# Patient Record
Sex: Female | Born: 1965 | Hispanic: No | Marital: Single | Smoking: Never smoker
Health system: Southern US, Community
[De-identification: ages and names within clinical notes are randomized; demographics above are authoritative.]

## PROBLEM LIST (undated history)

## (undated) DIAGNOSIS — R112 Nausea with vomiting, unspecified: Secondary | ICD-10-CM

## (undated) DIAGNOSIS — D259 Leiomyoma of uterus, unspecified: Secondary | ICD-10-CM

## (undated) DIAGNOSIS — Z5189 Encounter for other specified aftercare: Secondary | ICD-10-CM

## (undated) DIAGNOSIS — J189 Pneumonia, unspecified organism: Secondary | ICD-10-CM

## (undated) DIAGNOSIS — T7840XA Allergy, unspecified, initial encounter: Secondary | ICD-10-CM

## (undated) DIAGNOSIS — D649 Anemia, unspecified: Secondary | ICD-10-CM

## (undated) DIAGNOSIS — Z9889 Other specified postprocedural states: Secondary | ICD-10-CM

## (undated) HISTORY — PX: OTHER SURGICAL HISTORY: SHX169

## (undated) HISTORY — DX: Pneumonia, unspecified organism: J18.9

## (undated) HISTORY — DX: Allergy, unspecified, initial encounter: T78.40XA

## (undated) HISTORY — DX: Encounter for other specified aftercare: Z51.89

## (undated) HISTORY — DX: Anemia, unspecified: D64.9

## (undated) HISTORY — PX: MYOMECTOMY: SHX85

---

## 2011-03-16 ENCOUNTER — Ambulatory Visit: Payer: Self-pay | Admitting: Internal Medicine

## 2011-03-16 DIAGNOSIS — Z0289 Encounter for other administrative examinations: Secondary | ICD-10-CM

## 2011-04-04 ENCOUNTER — Encounter (HOSPITAL_COMMUNITY): Payer: Self-pay | Admitting: *Deleted

## 2011-04-04 ENCOUNTER — Other Ambulatory Visit: Payer: Self-pay | Admitting: Obstetrics and Gynecology

## 2011-04-13 NOTE — H&P (Signed)
NAMEMarland Kitchen  Stephanie Hunter, Stephanie Hunter NO.:  [id number]  MEDICAL RECORD NO.:  [id number]  LOCATION:  [facility]                         FACILITY:  [facility]  PHYSICIAN:  Janine Limbo, M.D.DATE OF BIRTH:  Sep 26, 1965  DATE OF ADMISSION:  04/04/2011 DATE OF DISCHARGE:                             HISTORY & PHYSICAL   Date of surgery is April 19, 2011.  HISTORY OF PRESENT ILLNESS:  Stephanie Hunter is a 46 year old female, para 0- 0-1-0, who presents for hysteroscopy with resection of endometrial polyps.  She will then have a dilatation and curettage.  The patient has been followed at the [facility].  The patient complains menorrhagia, fibroids, and anemia.  Her evaluation included an ultrasound which showed 11.5 x 9.52 cm uterus.  Multiple fibroids were noted with the largest fibroid measuring 3.81 cm.  The ovaries appeared normal.  A hydrosonogram showed a 1.69 cm endometrial polyp.  The patient had an endometrial biopsy that was benign.  Her most recent Pap smear was within normal limits.  Gonorrhea and Chlamydia cultures were negative. The patient is currently taking oral contraceptives.  DRUG ALLERGIES:  The patient has no drug allergies.  She is allergic to latex.  PAST MEDICAL HISTORY:  The patient has a history of anemia.  She had a Bartholin cyst removed in 2001 and again in 2011.  She had an abdominal myomectomy in 2008.  OBSTETRICAL HISTORY:  The patient has had one elective pregnancy termination.  SOCIAL HISTORY:  The patient denies cigarette use, alcohol use, and recreational drug use.  REVIEW OF SYSTEMS:  Please see history of present illness.  FAMILY HISTORY:  Noncontributory.  PHYSICAL EXAMINATION:  VITAL SIGNS:  Height is 5 feet 4 inches, weight is 217 pounds. HEENT:  Within normal limits.  CHEST:  Clear. HEART:  Regular rate and rhythm. BREASTS:  Without masses.  ABDOMEN:   Nontender. EXTREMITIES:  Grossly normal. NEUROLOGIC:  Grossly normal. PELVIC:  External genitalia is within normal limits.  Vagina is normal. Cervix is normal.  Uterus is upper limits of normal size as best I can tell.  Adnexa no masses. RECTOVAGINAL:  Confirms.  ASSESSMENT: 1. Menorrhagia. 2. Anemia. 3. Fibroid uterus. 4. Endometrial polyps. 5. Obesity.  PLAN:  The patient will undergo hysteroscopy with resection of endometrial polyps.  She will also have a dilatation and curettage.  She understands the indications for her surgical procedure, and she accepts the risks of, but not limited to, anesthetic complications, bleeding, infections, and possible damage to the surrounding organs.     Janine Limbo, M.D.     AVS/MEDQ  D:  04/12/2011  T:  04/13/2011  Job:  [id number]

## 2011-04-15 ENCOUNTER — Encounter (HOSPITAL_COMMUNITY): Payer: Self-pay | Admitting: Pharmacist

## 2011-04-19 ENCOUNTER — Encounter (HOSPITAL_COMMUNITY)
Admission: RE | Disposition: A | Discharge status: Home / Self Care | Payer: Self-pay | Source: Ambulatory Visit | Attending: Obstetrics and Gynecology

## 2011-04-19 ENCOUNTER — Encounter (HOSPITAL_COMMUNITY): Payer: Self-pay | Admitting: *Deleted

## 2011-04-19 ENCOUNTER — Other Ambulatory Visit: Payer: Self-pay | Admitting: Obstetrics and Gynecology

## 2011-04-19 ENCOUNTER — Encounter (HOSPITAL_COMMUNITY): Payer: Self-pay | Admitting: Anesthesiology

## 2011-04-19 ENCOUNTER — Ambulatory Visit (HOSPITAL_COMMUNITY)
Admission: RE | Admit: 2011-04-19 | Discharge: 2011-04-19 | Disposition: A | Discharge status: Home / Self Care | Payer: 59 | Source: Ambulatory Visit | Attending: Obstetrics and Gynecology | Admitting: Obstetrics and Gynecology

## 2011-04-19 ENCOUNTER — Ambulatory Visit (HOSPITAL_COMMUNITY): Payer: 59 | Admitting: Anesthesiology

## 2011-04-19 DIAGNOSIS — N840 Polyp of corpus uteri: Secondary | ICD-10-CM | POA: Insufficient documentation

## 2011-04-19 DIAGNOSIS — D649 Anemia, unspecified: Secondary | ICD-10-CM | POA: Insufficient documentation

## 2011-04-19 DIAGNOSIS — N920 Excessive and frequent menstruation with regular cycle: Secondary | ICD-10-CM | POA: Insufficient documentation

## 2011-04-19 DIAGNOSIS — D259 Leiomyoma of uterus, unspecified: Secondary | ICD-10-CM | POA: Insufficient documentation

## 2011-04-19 HISTORY — DX: Nausea with vomiting, unspecified: R11.2

## 2011-04-19 HISTORY — DX: Other specified postprocedural states: Z98.890

## 2011-04-19 HISTORY — DX: Leiomyoma of uterus, unspecified: D25.9

## 2011-04-19 LAB — CBC
MCV: 82.7 fL (ref 78.0–100.0)
Platelets: 487 10*3/uL — ABNORMAL HIGH (ref 150–400)
RBC: 4.05 MIL/uL (ref 3.87–5.11)
WBC: 9.5 10*3/uL (ref 4.0–10.5)

## 2011-04-19 SURGERY — DILATATION & CURETTAGE/HYSTEROSCOPY WITH RESECTOCOPE
Anesthesia: General | Site: Uterus | Wound class: Clean Contaminated

## 2011-04-19 MED ORDER — KETOROLAC TROMETHAMINE 30 MG/ML IJ SOLN
INTRAMUSCULAR | Status: DC | PRN
  Administered 2011-04-19: 30 mg via INTRAVENOUS

## 2011-04-19 MED ORDER — GLYCOPYRROLATE 0.2 MG/ML IJ SOLN
INTRAMUSCULAR | Status: DC | PRN
  Administered 2011-04-19: 0.3 mg via INTRAVENOUS

## 2011-04-19 MED ORDER — BUPIVACAINE-EPINEPHRINE (PF) 0.5% -1:200000 IJ SOLN
INTRAMUSCULAR | Status: AC
  Filled 2011-04-19: qty 10

## 2011-04-19 MED ORDER — BUPIVACAINE-EPINEPHRINE 0.5% -1:200000 IJ SOLN
INTRAMUSCULAR | Status: DC | PRN
  Administered 2011-04-19: 10 mL

## 2011-04-19 MED ORDER — PROPOFOL 10 MG/ML IV EMUL
INTRAVENOUS | Status: DC | PRN
  Administered 2011-04-19: 50 mg via INTRAVENOUS

## 2011-04-19 MED ORDER — PROPOFOL 10 MG/ML IV EMUL
INTRAVENOUS | Status: AC
  Filled 2011-04-19: qty 20

## 2011-04-19 MED ORDER — PROMETHAZINE HCL 12.5 MG PO TABS: 12.5000 mg | ORAL_TABLET | Freq: Four times a day (QID) | ORAL | Status: AC | PRN

## 2011-04-19 MED ORDER — ONDANSETRON HCL 4 MG/2ML IJ SOLN
INTRAMUSCULAR | Status: DC | PRN
  Administered 2011-04-19: 4 mg via INTRAVENOUS

## 2011-04-19 MED ORDER — ONDANSETRON HCL 4 MG/2ML IJ SOLN
INTRAMUSCULAR | Status: AC
  Filled 2011-04-19: qty 2

## 2011-04-19 MED ORDER — KETOROLAC TROMETHAMINE 60 MG/2ML IM SOLN
INTRAMUSCULAR | Status: DC | PRN
  Administered 2011-04-19: 30 mg via INTRAMUSCULAR

## 2011-04-19 MED ORDER — DEXAMETHASONE SODIUM PHOSPHATE 4 MG/ML IJ SOLN
INTRAMUSCULAR | Status: DC | PRN
  Administered 2011-04-19: 10 mg via INTRAVENOUS

## 2011-04-19 MED ORDER — SCOPOLAMINE 1 MG/3DAYS TD PT72
MEDICATED_PATCH | TRANSDERMAL | Status: AC
  Administered 2011-04-19: 1.5 mg via TRANSDERMAL
  Filled 2011-04-19: qty 1

## 2011-04-19 MED ORDER — FENTANYL CITRATE 0.05 MG/ML IJ SOLN
INTRAMUSCULAR | Status: DC | PRN
  Administered 2011-04-19: 50 ug via INTRAVENOUS
  Administered 2011-04-19: 100 ug via INTRAVENOUS

## 2011-04-19 MED ORDER — MORPHINE SULFATE 10 MG/ML IJ SOLN
INTRAMUSCULAR | Status: AC
  Filled 2011-04-19: qty 1

## 2011-04-19 MED ORDER — IBUPROFEN 200 MG PO TABS: 800.0000 mg | ORAL_TABLET | Freq: Three times a day (TID) | ORAL | Status: AC | PRN

## 2011-04-19 MED ORDER — MIDAZOLAM HCL 2 MG/2ML IJ SOLN
INTRAMUSCULAR | Status: AC
  Filled 2011-04-19: qty 2

## 2011-04-19 MED ORDER — GLYCINE 1.5 % IR SOLN
Status: DC | PRN
  Administered 2011-04-19: 3000 mL

## 2011-04-19 MED ORDER — DEXAMETHASONE SODIUM PHOSPHATE 10 MG/ML IJ SOLN
INTRAMUSCULAR | Status: AC
  Filled 2011-04-19: qty 1

## 2011-04-19 MED ORDER — SCOPOLAMINE 1 MG/3DAYS TD PT72
1.0000 | MEDICATED_PATCH | Freq: Once | TRANSDERMAL | Status: DC
  Administered 2011-04-19: 1.5 mg via TRANSDERMAL

## 2011-04-19 MED ORDER — FENTANYL CITRATE 0.05 MG/ML IJ SOLN
INTRAMUSCULAR | Status: AC
  Filled 2011-04-19: qty 5

## 2011-04-19 MED ORDER — KETOROLAC TROMETHAMINE 60 MG/2ML IM SOLN
INTRAMUSCULAR | Status: AC
  Filled 2011-04-19: qty 2

## 2011-04-19 MED ORDER — LIDOCAINE HCL (CARDIAC) 20 MG/ML IV SOLN
INTRAVENOUS | Status: AC
  Filled 2011-04-19: qty 5

## 2011-04-19 MED ORDER — LIDOCAINE HCL (CARDIAC) 20 MG/ML IV SOLN
INTRAVENOUS | Status: DC | PRN
  Administered 2011-04-19: 60 mg via INTRAVENOUS

## 2011-04-19 MED ORDER — LACTATED RINGERS IV SOLN
INTRAVENOUS | Status: DC
  Administered 2011-04-19 (×2): via INTRAVENOUS

## 2011-04-19 MED ORDER — MIDAZOLAM HCL 5 MG/5ML IJ SOLN
INTRAMUSCULAR | Status: DC | PRN
  Administered 2011-04-19: 2 mg via INTRAVENOUS

## 2011-04-19 MED ORDER — OXYCODONE-ACETAMINOPHEN 5-325 MG PO TABS: 1.0000 | ORAL_TABLET | ORAL | Status: AC | PRN

## 2011-04-19 SURGICAL SUPPLY — 16 items
CANISTER SUCTION 2500CC (MISCELLANEOUS) ×2 IMPLANT
CATH ROBINSON RED A/P 16FR (CATHETERS) ×2 IMPLANT
CLOTH BEACON ORANGE TIMEOUT ST (SAFETY) ×2 IMPLANT
CONTAINER PREFILL 10% NBF 60ML (FORM) ×6 IMPLANT
DRAPE PROXIMA HALF (DRAPES) ×2 IMPLANT
ELECT REM PT RETURN 9FT ADLT (ELECTROSURGICAL) ×2
ELECTRODE REM PT RTRN 9FT ADLT (ELECTROSURGICAL) ×1 IMPLANT
GLOVE BIOGEL PI IND STRL 8.5 (GLOVE) ×1 IMPLANT
GLOVE BIOGEL PI INDICATOR 8.5 (GLOVE) ×1
GLOVE ECLIPSE 8.0 STRL XLNG CF (GLOVE) ×4 IMPLANT
GOWN PREVENTION PLUS LG XLONG (DISPOSABLE) ×4 IMPLANT
GOWN STRL REIN XL XLG (GOWN DISPOSABLE) ×2 IMPLANT
LOOP ANGLED CUTTING 22FR (CUTTING LOOP) ×2 IMPLANT
PACK HYSTEROSCOPY LF (CUSTOM PROCEDURE TRAY) ×2 IMPLANT
TOWEL OR 17X24 6PK STRL BLUE (TOWEL DISPOSABLE) ×4 IMPLANT
WATER STERILE IRR 1000ML POUR (IV SOLUTION) ×2 IMPLANT

## 2011-04-19 NOTE — Anesthesia Preprocedure Evaluation (Signed)
Anesthesia Evaluation  Patient identified by MRN, date of birth, ID band Patient awake    Reviewed: Allergy & Precautions, H&P , Patient's Chart, lab work & pertinent test results, reviewed documented beta blocker date and time   History of Anesthesia Complications (+) PONV  Airway Mallampati: II TM Distance: >3 FB Neck ROM: full    Dental No notable dental hx.    Pulmonary neg pulmonary ROS,  clear to auscultation  Pulmonary exam normal       Cardiovascular Exercise Tolerance: Good neg cardio ROS regular Normal    Neuro/Psych Negative Neurological ROS  Negative Psych ROS   GI/Hepatic negative GI ROS, Neg liver ROS,   Endo/Other  Negative Endocrine ROS  Renal/GU negative Renal ROS     Musculoskeletal   Abdominal   Peds  Hematology negative hematology ROS (+)   Anesthesia Other Findings   Reproductive/Obstetrics negative OB ROS                           Anesthesia Physical Anesthesia Plan  ASA: II  Anesthesia Plan: General LMA   Post-op Pain Management:    Induction:   Airway Management Planned:   Additional Equipment:   Intra-op Plan:   Post-operative Plan:   Informed Consent: I have reviewed the patients History and Physical, chart, labs and discussed the procedure including the risks, benefits and alternatives for the proposed anesthesia with the patient or authorized representative who has indicated his/her understanding and acceptance.   Dental Advisory Given  Plan Discussed with: CRNA, Surgeon and Anesthesiologist  Anesthesia Plan Comments:         Anesthesia Quick Evaluation  

## 2011-04-19 NOTE — Discharge Instructions (Signed)
Hysteroscopy Hysteroscopy is a procedure used for looking inside the womb (uterus). It may be done for many different reasons, including:  To evaluate abnormal bleeding, fibroid (benign, noncancerous) tumors, polyps, scar tissue (adhesions), and possibly cancer of the uterus.   To look for lumps (tumors) and other uterine growths.   To look for causes of why a woman cannot get pregnant (infertility), causes of recurrent loss of pregnancy (miscarriages), or a lost intrauterine device (IUD).   To perform a sterilization by blocking the fallopian tubes from inside the uterus.  A hysteroscopy should be done right after a menstrual period to be sure you are not pregnant. LET YOUR CAREGIVER KNOW ABOUT:   Allergies.   Medicines taken, including herbs, eyedrops, over-the-counter medicines, and creams.   Use of steroids (by mouth or creams).   Previous problems with anesthetics or numbing medicines.   History of bleeding or blood problems.   History of blood clots.   Possibility of pregnancy, if this applies.   Previous surgery.   Other health problems.  RISKS AND COMPLICATIONS   Putting a hole in the uterus.   Excessive bleeding.   Infection.   Damage to the cervix.   Injury to other organs.   Allergic reaction to medicines.   Too much fluid used in the uterus for the procedure.  BEFORE THE PROCEDURE   Do not take aspirin or blood thinners for a week before the procedure, or as directed. It can cause bleeding.   Arrive at least 60 minutes before the procedure or as directed to read and sign the necessary forms.   Arrange for someone to take you home after the procedure.   If you smoke, do not smoke for 2 weeks before the procedure.  PROCEDURE   Your caregiver may give you medicine to relax you. He or she may also give you a medicine that numbs the area around the cervix (local anesthetic) or a medicine that makes you sleep (general anesthesia).   Sometimes, a  medicine is placed in the cervix the day before the procedure. This medicine makes the cervix have a larger opening (dilate). This makes it easier for the instrument to be inserted into the uterus.   A small instrument (hysteroscope) is inserted through the vagina into the uterus. This instrument is similar to a pencil-sized telescope with a light.   During the procedure, air or a liquid is put into the uterus, which allows the surgeon to see better.   Sometimes, tissue is gently scraped from inside the uterus. These tissue samples are sent to a specialist who looks at tissue samples (pathologist). The pathologist will give a report to your caregiver. This will help your caregiver decide if further treatment is necessary. The report will also help your caregiver decide on the best treatment if the test comes back abnormal.  AFTER THE PROCEDURE   If you had a general anesthetic, you may be groggy for a couple hours after the procedure.   If you had a local anesthetic, you will be advised to rest at the surgical center or caregiver's office until you are stable and feel ready to go home.   You may have some cramping for a couple days.   You may have bleeding, which varies from light spotting for a few days to menstrual-like bleeding for up to 3 to 7 days. This is normal.   Have someone take you home.  FINDING OUT THE RESULTS OF YOUR TEST Not all test results  are available during your visit. If your test results are not back during the visit, make an appointment with your caregiver to find out the results. Do not assume everything is normal if you have not heard from your caregiver or the medical facility. It is important for you to follow up on all of your test results. HOME CARE INSTRUCTIONS   Do not drive for 24 hours or as instructed.   Only take over-the-counter or prescription medicines for pain, discomfort, or fever as directed by your caregiver.   Do not take aspirin. It can cause or  aggravate bleeding.   Do not drive or drink alcohol while taking pain medicine.   You may resume your usual diet.   Do not use tampons, douche, or have sexual intercourse for 2 weeks, or as advised by your caregiver.   Rest and sleep for the first 24 to 48 hours.   Take your temperature twice a day for 4 to 5 days. Write it down. Give these temperatures to your caregiver if they are abnormal (above 98.6 F or 37.0 C).   Take medicines your caregiver has ordered as directed.   Follow your caregiver's advice regarding diet, exercise, lifting, driving, and general activities.   Take showers instead of baths for 2 weeks, or as recommended by your caregiver.   If you develop constipation:   Take a mild laxative with the advice of your caregiver.   Eat bran foods.   Drink enough water and fluids to keep your urine clear or pale yellow.   Try to have someone with you or available to you for the first 24 to 48 hours, especially if you had a general anesthetic.   Make sure you and your family understand everything about your operation and recovery.   Follow your caregiver's advice regarding follow-up appointments and Pap smears.  SEEK MEDICAL CARE IF:   You feel dizzy or lightheaded.   You feel sick to your stomach (nauseous).   You develop abnormal vaginal discharge.   You develop a rash.   You have an abnormal reaction or allergy to your medicine.   You need stronger pain medicine.  SEEK IMMEDIATE MEDICAL CARE IF:   Bleeding is heavier than a normal menstrual period or you have blood clots.   You have an oral temperature above 102 F (38.9 C), not controlled by medicine.   You have increasing cramps or pains not relieved with medicine.   You develop belly (abdominal) pain that does not seem to be related to the same area of earlier cramping and pain.   You pass out.   You develop pain in the tops of your shoulders (shoulder strap areas).   You develop shortness of  breath.  MAKE SURE YOU:   Understand these instructions.   Will watch your condition.   Will get help right away if you are not doing well or get worse.  Document Released: 06/04/2000 Document Revised: 11/08/2010 Document Reviewed: 09/27/2008 [facility] Patient Information 2012 [place], [place].DISCHARGE INSTRUCTIONS: D&C / D&E The following instructions have been prepared to help you care for yourself upon your return home.   Personal hygiene:  Use sanitary pads for vaginal drainage, not tampons.  Shower the day after your procedure.  NO tub baths, pools or Jacuzzis for 2-3 weeks.  Wipe front to back after using the bathroom.  Activity and limitations:  Do NOT drive or operate any equipment for 24 hours. The effects of anesthesia are still present and drowsiness  may result.  Do NOT rest in bed all day.  Walking is encouraged.  Walk up and down stairs slowly.  You may resume your normal activity in one to two days or as indicated by your physician.  Sexual activity: NO intercourse for at least 2 weeks after the procedure, or as indicated by your physician.  Diet: Eat a light meal as desired this evening. You may resume your usual diet tomorrow.  Return to work: You may resume your work activities in one to two days or as indicated by your doctor.  What to expect after your surgery: Expect to have vaginal bleeding/discharge for 2-3 days and spotting for up to 10 days. It is not unusual to have soreness for up to 1-2 weeks. You may have a slight burning sensation when you urinate for the first day. Mild cramps may continue for a couple of days. You may have a regular period in 2-6 weeks.  Call your doctor for any of the following:  Excessive vaginal bleeding, saturating and changing one pad every hour.  Inability to urinate 6 hours after discharge from hospital.  Pain not relieved by pain medication.  Fever of 100.4 F or greater.  Unusual vaginal discharge or  odor.  Return to office ________________ Call for an appointment ___________________  Patients signature: ______________________  Nurses signature ________________________  Post Anesthesia Care Unit [phone]

## 2011-04-19 NOTE — Transfer of Care (Signed)
Immediate Anesthesia Transfer of Care Note  Patient: Stephanie Hunter  Procedure(s) Performed:  DILATATION & CURETTAGE/HYSTEROSCOPY WITH RESECTOCOPE  Patient Location: PACU  Anesthesia Type: General  Level of Consciousness: awake, alert  and oriented  Airway & Oxygen Therapy: Patient Spontanous Breathing and Patient connected to nasal cannula oxygen  Post-op Assessment: Report given to PACU RN and Post -op Vital signs reviewed and stable  Post vital signs: Reviewed and stable  Complications: No apparent anesthesia complications

## 2011-04-19 NOTE — H&P (Signed)
The patient was interviewed and examined today.  The previously documented history and physical examination was reviewed. There are no changes. The operative procedure was reviewed. The risks and benefits were outlined again. The specific risks include, but are not limited to, anesthetic complications, bleeding, infections, and possible damage to the surrounding organs. The patient's questions were answered.  We are ready to proceed as outlined. The likelihood of the patient achieving the goals of this procedure is very likely.   Stephanie Hunter, M.D.  

## 2011-04-19 NOTE — Op Note (Signed)
OPERATIVE NOTE  Stephanie Hunter  DOB:    04/27/1965  MRN:    [id number]  CSN:    [id number]  Date of Surgery:  04/19/2011  Preoperative Diagnosis:  Menorrhagia  Anemia (hemoglobin 10.7)  Fibroids  Endometrial polyps  Obesity  Postoperative Diagnosis:  Same  Procedure:  Hysteroscopy with resection of endometrial polyps  Dilatation and curettage  Surgeon:  Leonard Schwartz, M.D.  Assistant:  None  Anesthetic:  Gen.  Disposition:  The patient is a 46 year old female, para 0-0-1-0, who presents with the above-mentioned diagnosis. She understands the indications for surgical procedure and she accepts the risk of, but not limited to, anesthetic complications, bleeding, infections, and possible damage to the surrounding organs.  Findings:  The uterus is 12-14 week size. Multiple fibroids are present. No adnexal masses were appreciated. On hysteroscopy the patient was noted to have several endometrial polyps with the largest polyp measuring approximately 1-1/2 cm in size. No other pathology was appreciated.  Procedure:  The patient was taken to the operating room where a general anesthetic was given. The patient's abdomen, perineum, and vagina were prepped with multiple layers of Betadine. The bladder was drained of urine. Examination under anesthesia was performed. A paracervical block was placed using 10 cc of half percent Marcaine with epinephrine. An endocervical curettage was performed. The uterus sounded to 10 cm. The cervix was gently dilated. The diagnostic hysteroscope was inserted and the cavity was carefully inspected. Pictures were taken. Polyps were noted. The diagnostic hysteroscope was removed and the cervix was dilated further. The operative hysteroscope was inserted. A single loop was used to resect the endometrial polyps. The cavity was then curetted using a medium sharp curet. The cavity was to be clean at the end of our procedure. The  hysteroscope was reinserted and the cavity was inspected. No polyps were noted. On sutures were removed. The uterus was reexamined and was noted to be firm. Hemostasis was adequate. Sponge and needle counts were correct. The estimated blood loss was less than team 10 cc. The estimated fluid deficit was 155 cc. The patient was awakened from her anesthetic without difficulty and she was then transported to the recovery room in stable condition. The endocervical curettings, endometrial resections, and endometrial curettings were sent to pathology.  Followup instructions:  The patient will return to see Dr. Stefano Gaul in 2 weeks for followup examination. She was given a prescription for Motrin, Percocet, and Phenergan. She will call for questions or concerns.  Leonard Schwartz, M.D.

## 2011-04-19 NOTE — Anesthesia Procedure Notes (Signed)
Procedure Name: LMA Insertion Date/Time: 04/19/2011 9:35 AM Performed by: Isabella Bowens Pre-anesthesia Checklist: Patient identified, Emergency Drugs available, Suction available, Patient being monitored and Timeout performed Patient Re-evaluated:Patient Re-evaluated prior to inductionOxygen Delivery Method: Circle System Utilized Preoxygenation: Pre-oxygenation with 100% oxygen Intubation Type: IV induction Ventilation: Mask ventilation without difficulty LMA: LMA inserted LMA Size: 4.0 Grade View: Grade II Number of attempts: 1 Placement Confirmation: positive ETCO2 and breath sounds checked- equal and bilateral Dental Injury: Teeth and Oropharynx as per pre-operative assessment  Difficulty Due To: Difficulty was unanticipated

## 2011-04-20 NOTE — Anesthesia Postprocedure Evaluation (Signed)
Anesthesia Post Note  Patient: Stephanie Hunter  Procedure(s) Performed:  DILATATION & CURETTAGE/HYSTEROSCOPY WITH RESECTOCOPE  Anesthesia type: GA  Patient location: PACU  Post pain: Pain level controlled  Post assessment: Post-op Vital signs reviewed  Last Vitals:  Filed Vitals:   04/19/11 1115  BP: 107/53  Pulse: 91  Temp: 36.9 C  Resp: 15    Post vital signs: Reviewed  Level of consciousness: sedated  Complications: No apparent anesthesia complications

## 2012-02-26 ENCOUNTER — Encounter: Payer: Self-pay | Admitting: Obstetrics and Gynecology

## 2012-02-26 ENCOUNTER — Ambulatory Visit (INDEPENDENT_AMBULATORY_CARE_PROVIDER_SITE_OTHER): Payer: 59 | Admitting: Obstetrics and Gynecology

## 2012-02-26 ENCOUNTER — Other Ambulatory Visit: Payer: Self-pay | Admitting: Obstetrics and Gynecology

## 2012-02-26 VITALS — BP 110/70 | Resp 16 | Wt 219.0 lb

## 2012-02-26 DIAGNOSIS — Z01419 Encounter for gynecological examination (general) (routine) without abnormal findings: Secondary | ICD-10-CM

## 2012-02-26 DIAGNOSIS — Z124 Encounter for screening for malignant neoplasm of cervix: Secondary | ICD-10-CM

## 2012-02-26 DIAGNOSIS — Z1231 Encounter for screening mammogram for malignant neoplasm of breast: Secondary | ICD-10-CM

## 2012-02-26 DIAGNOSIS — N920 Excessive and frequent menstruation with regular cycle: Secondary | ICD-10-CM

## 2012-02-26 MED ORDER — NORETHINDRONE ACET-ETHINYL EST 1-20 MG-MCG PO TABS: 1.0000 | ORAL_TABLET | Freq: Every day | ORAL | Status: AC

## 2012-02-26 NOTE — Progress Notes (Signed)
ANNUAL GYNECOLOGIC EXAMINATION   Stephanie Hunter is a 46 y.o. female, G1P0010, who presents for an annual exam. The patient has a history of menorrhagia.  She takes oral contraceptives.  Her cycles are much better.  She had a D&C in 2013.  Pathology report was benign.   History   Social History  . Marital Status: Single    Spouse Name: N/A    Number of Children: N/A  . Years of Education: N/A   Social History Main Topics  . Smoking status: Never Smoker   . Smokeless tobacco: Never Used  . Alcohol Use: No  . Drug Use: No  . Sexually Active: Yes -- Female partner(s)    Birth Control/ Protection: Pill     Comment: loestrin 1/20   Other Topics Concern  . None   Social History Narrative  . None    Menstrual cycle:   LMP: Patient's last menstrual period was 02/05/2012.             The following portions of the patient's history were reviewed and updated as appropriate: allergies, current medications, past family history, past medical history, past social history, past surgical history and problem list.  Review of Systems Pertinent items are noted in HPI. Breast:Negative for breast lump,nipple discharge or nipple retraction Gastrointestinal: Negative for abdominal pain, change in bowel habits or rectal bleeding Urinary:negative   Objective:    BP 110/70  Resp 16  Wt 219 lb (99.338 kg)  LMP 02/05/2012    Weight:  Wt Readings from Last 1 Encounters:  02/26/12 219 lb (99.338 kg)          BMI: There is no height on file to calculate BMI.  General Appearance: Alert, appropriate appearance for age. No acute distress HEENT: Grossly normal Neck / Thyroid: Supple, no masses, nodes or enlargement Lungs: clear to auscultation bilaterally Back: No CVA tenderness Breast Exam: No masses or nodes.No dimpling, nipple retraction or discharge. Cardiovascular: Regular rate and rhythm. S1, S2, no murmur Gastrointestinal: Soft, non-tender, no masses or  organomegaly  ++++++++++++++++++++++++++++++++++++++++++++++++++++++++  Pelvic Exam: External genitalia: normal general appearance Vaginal: normal without tenderness, induration or masses. Relaxation: No Cervix: normal appearance Adnexa: normal bimanual exam Uterus: normal size, shape, and consistency Rectovaginal: normal rectal, no masses  ++++++++++++++++++++++++++++++++++++++++++++++++++++++++  Lymphatic Exam: Non-palpable nodes in neck, clavicular, axillary, or inguinal regions Neurologic: Normal speech, no tremor  Psychiatric: Alert and oriented, appropriate affect.  Assessment:    Normal gyn exam   Overweight or obese: Yes   Pelvic relaxation: No  Menorrhagia; improved on oral contraceptives.   Plan:    mammogram pap smear return annually or prn Contraception:oral contraceptives (estrogen/progesterone)    Medications prescribed: Loestrin 1.0 /20  STD screen request: No   The updated Pap smear screening guidelines were discussed with the patient. The patient requested that I obtain a Pap smear: Yes.  Kegel exercises discussed: No.  Proper diet and regular exercise were reviewed.  Annual mammograms recommended starting at age 43. Proper breast care was discussed.  Screening colonoscopy is recommended beginning at age 47.  Regular health maintenance was reviewed.  Sleep hygiene was discussed.  Adequate calcium and vitamin D intake was emphasized.  Leonard Schwartz M.D.    Regular Periods: yes Mammogram: 85yrs ago per pt (time)  Monthly Breast Ex.: yes Exercise: no  Tetanus < 10 years: yes Seatbelts: yes  NI. Bladder Functn.: yes Abuse at home: no  Daily BM's: yes Stressful Work: yes  Healthy Diet: yes Sigmoid-Colonoscopy: none  Calcium: no Medical problems this year: no concerns just needs BC rf    LAST PAP:03/12/11 WNL  Contraception: Loestrin 1/20  Mammogram:  4 yrs ago in [place] WNL per pt   PCP: Cloward, Earlene Plater  PMH: taking Vit D & Fe  supplements due to low blood levels and thyroid was abnormal unsure hyper or hypo recheck due in 6 mos per pt  FMH: no changes   Last Bone Scan: none

## 2012-02-27 LAB — PAP IG W/ RFLX HPV ASCU

## 2012-03-28 ENCOUNTER — Ambulatory Visit: Payer: 59

## 2013-01-02 ENCOUNTER — Other Ambulatory Visit: Payer: Self-pay

## 2013-01-02 DIAGNOSIS — Z1231 Encounter for screening mammogram for malignant neoplasm of breast: Secondary | ICD-10-CM

## 2013-01-29 ENCOUNTER — Ambulatory Visit
Admission: RE | Admit: 2013-01-29 | Discharge: 2013-01-29 | Disposition: A | Discharge status: Home / Self Care | Payer: 59 | Source: Ambulatory Visit

## 2013-01-29 DIAGNOSIS — Z1231 Encounter for screening mammogram for malignant neoplasm of breast: Secondary | ICD-10-CM

## 2014-01-11 ENCOUNTER — Encounter: Payer: Self-pay | Admitting: Obstetrics and Gynecology

## 2014-04-07 ENCOUNTER — Other Ambulatory Visit: Payer: Self-pay | Admitting: Obstetrics and Gynecology

## 2014-04-07 DIAGNOSIS — Z1231 Encounter for screening mammogram for malignant neoplasm of breast: Secondary | ICD-10-CM

## 2014-04-14 ENCOUNTER — Ambulatory Visit: Payer: Self-pay

## 2014-04-19 ENCOUNTER — Ambulatory Visit: Payer: Self-pay

## 2014-04-26 ENCOUNTER — Ambulatory Visit: Payer: Self-pay

## 2014-04-27 ENCOUNTER — Ambulatory Visit
Admission: RE | Admit: 2014-04-27 | Discharge: 2014-04-27 | Disposition: A | Discharge status: Home / Self Care | Payer: 59 | Source: Ambulatory Visit | Attending: Obstetrics and Gynecology | Admitting: Obstetrics and Gynecology

## 2014-04-27 DIAGNOSIS — Z1231 Encounter for screening mammogram for malignant neoplasm of breast: Secondary | ICD-10-CM

## 2015-03-13 DIAGNOSIS — J189 Pneumonia, unspecified organism: Secondary | ICD-10-CM

## 2015-03-13 HISTORY — DX: Pneumonia, unspecified organism: J18.9

## 2015-07-04 DIAGNOSIS — Z8639 Personal history of other endocrine, nutritional and metabolic disease: Secondary | ICD-10-CM | POA: Insufficient documentation

## 2015-07-04 DIAGNOSIS — R7989 Other specified abnormal findings of blood chemistry: Secondary | ICD-10-CM | POA: Insufficient documentation

## 2016-09-02 ENCOUNTER — Emergency Department (HOSPITAL_COMMUNITY)
Admission: EM | Admit: 2016-09-02 | Discharge: 2016-09-02 | Disposition: A | Discharge status: Home / Self Care | Payer: 59 | Attending: Emergency Medicine | Admitting: Emergency Medicine

## 2016-09-02 ENCOUNTER — Emergency Department (HOSPITAL_COMMUNITY): Payer: 59

## 2016-09-02 ENCOUNTER — Encounter (HOSPITAL_COMMUNITY): Payer: Self-pay

## 2016-09-02 DIAGNOSIS — Y939 Activity, unspecified: Secondary | ICD-10-CM | POA: Diagnosis not present

## 2016-09-02 DIAGNOSIS — Y999 Unspecified external cause status: Secondary | ICD-10-CM | POA: Diagnosis not present

## 2016-09-02 DIAGNOSIS — S299XXA Unspecified injury of thorax, initial encounter: Secondary | ICD-10-CM | POA: Insufficient documentation

## 2016-09-02 DIAGNOSIS — Y92410 Unspecified street and highway as the place of occurrence of the external cause: Secondary | ICD-10-CM | POA: Diagnosis not present

## 2016-09-02 DIAGNOSIS — K7689 Other specified diseases of liver: Secondary | ICD-10-CM | POA: Diagnosis not present

## 2016-09-02 DIAGNOSIS — L231 Allergic contact dermatitis due to adhesives: Secondary | ICD-10-CM | POA: Diagnosis not present

## 2016-09-02 DIAGNOSIS — R079 Chest pain, unspecified: Secondary | ICD-10-CM | POA: Diagnosis present

## 2016-09-02 DIAGNOSIS — Z79899 Other long term (current) drug therapy: Secondary | ICD-10-CM | POA: Insufficient documentation

## 2016-09-02 LAB — HEPATIC FUNCTION PANEL
ALBUMIN: 3.4 g/dL — AB (ref 3.5–5.0)
ALK PHOS: 54 U/L (ref 38–126)
ALT: 12 U/L — ABNORMAL LOW (ref 14–54)
AST: 22 U/L (ref 15–41)
BILIRUBIN TOTAL: 0.8 mg/dL (ref 0.3–1.2)
Total Protein: 7.5 g/dL (ref 6.5–8.1)

## 2016-09-02 LAB — CBC
HCT: 33.3 % — ABNORMAL LOW (ref 36.0–46.0)
Hemoglobin: 10.6 g/dL — ABNORMAL LOW (ref 12.0–15.0)
MCH: 25.6 pg — AB (ref 26.0–34.0)
MCHC: 31.8 g/dL (ref 30.0–36.0)
MCV: 80.4 fL (ref 78.0–100.0)
PLATELETS: 524 10*3/uL — AB (ref 150–400)
RBC: 4.14 MIL/uL (ref 3.87–5.11)
RDW: 15.5 % (ref 11.5–15.5)
WBC: 13.3 10*3/uL — AB (ref 4.0–10.5)

## 2016-09-02 LAB — BASIC METABOLIC PANEL
Anion gap: 8 (ref 5–15)
BUN: 9 mg/dL (ref 6–20)
CALCIUM: 8.7 mg/dL — AB (ref 8.9–10.3)
CO2: 21 mmol/L — ABNORMAL LOW (ref 22–32)
Chloride: 104 mmol/L (ref 101–111)
Creatinine, Ser: 0.74 mg/dL (ref 0.44–1.00)
GFR calc Af Amer: >60 mL/min (ref >60)
Glucose, Bld: 103 mg/dL — ABNORMAL HIGH (ref 65–99)
POTASSIUM: 3.6 mmol/L (ref 3.5–5.1)
SODIUM: 133 mmol/L — AB (ref 135–145)

## 2016-09-02 LAB — I-STAT BETA HCG BLOOD, ED (MC, WL, AP ONLY)

## 2016-09-02 MED ORDER — IOPAMIDOL (ISOVUE-300) INJECTION 61%
100.0000 mL | Freq: Once | INTRAVENOUS | Status: AC | PRN
  Administered 2016-09-02: 100 mL via INTRAVENOUS

## 2016-09-02 MED ORDER — IBUPROFEN 400 MG PO TABS
600.0000 mg | ORAL_TABLET | Freq: Once | ORAL | Status: AC
  Administered 2016-09-02: 600 mg via ORAL
  Filled 2016-09-02: qty 1

## 2016-09-02 MED ORDER — ACETAMINOPHEN 325 MG PO TABS
650.0000 mg | ORAL_TABLET | Freq: Once | ORAL | Status: AC
  Administered 2016-09-02: 650 mg via ORAL
  Filled 2016-09-02: qty 2

## 2016-09-02 MED ORDER — IBUPROFEN 400 MG PO TABS
600.0000 mg | ORAL_TABLET | Freq: Once | ORAL | Status: AC
  Administered 2016-09-02: 19:00:00 600 mg via ORAL
  Filled 2016-09-02: qty 1

## 2016-09-02 NOTE — ED Notes (Signed)
Pt taken to Xray.

## 2016-09-02 NOTE — ED Notes (Signed)
MD Little at the bedside  

## 2016-09-02 NOTE — Discharge Instructions (Signed)
Please note you may have increasing diffuse soreness over next 24-48 hours. Please take ibuprofen and tylenol every 4-6 hours as needed for pain. Please followup with primary doctor for monitoring of incidental liver nodule
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
## 2016-09-02 NOTE — ED Notes (Signed)
Pt taken to CT.

## 2016-09-02 NOTE — ED Notes (Signed)
Pt returned from CT °

## 2016-09-02 NOTE — ED Notes (Signed)
Pt brought back from Xray
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
## 2016-09-17 ENCOUNTER — Other Ambulatory Visit: Payer: Self-pay | Admitting: Family Medicine

## 2016-09-17 DIAGNOSIS — E079 Disorder of thyroid, unspecified: Secondary | ICD-10-CM

## 2016-09-17 DIAGNOSIS — E0789 Other specified disorders of thyroid: Secondary | ICD-10-CM

## 2016-09-24 ENCOUNTER — Ambulatory Visit
Admission: RE | Admit: 2016-09-24 | Discharge: 2016-09-24 | Disposition: A | Discharge status: Home / Self Care | Payer: 59 | Source: Ambulatory Visit | Attending: Family Medicine | Admitting: Family Medicine

## 2016-09-24 DIAGNOSIS — E0789 Other specified disorders of thyroid: Secondary | ICD-10-CM

## 2016-09-24 DIAGNOSIS — E079 Disorder of thyroid, unspecified: Secondary | ICD-10-CM

## 2017-08-15 ENCOUNTER — Encounter: Payer: Self-pay | Admitting: Gastroenterology

## 2017-10-03 ENCOUNTER — Ambulatory Visit (AMBULATORY_SURGERY_CENTER): Payer: Self-pay | Admitting: *Deleted

## 2017-10-03 VITALS — Ht 64.0 in | Wt 230.0 lb

## 2017-10-03 DIAGNOSIS — A599 Trichomoniasis, unspecified: Secondary | ICD-10-CM | POA: Insufficient documentation

## 2017-10-03 DIAGNOSIS — R87810 Cervical high risk human papillomavirus (HPV) DNA test positive: Secondary | ICD-10-CM | POA: Insufficient documentation

## 2017-10-03 DIAGNOSIS — D259 Leiomyoma of uterus, unspecified: Secondary | ICD-10-CM | POA: Insufficient documentation

## 2017-10-03 DIAGNOSIS — N920 Excessive and frequent menstruation with regular cycle: Secondary | ICD-10-CM | POA: Insufficient documentation

## 2017-10-03 DIAGNOSIS — Z1211 Encounter for screening for malignant neoplasm of colon: Secondary | ICD-10-CM

## 2017-10-03 DIAGNOSIS — D649 Anemia, unspecified: Secondary | ICD-10-CM | POA: Insufficient documentation

## 2017-10-03 DIAGNOSIS — K649 Unspecified hemorrhoids: Secondary | ICD-10-CM | POA: Insufficient documentation

## 2017-10-03 DIAGNOSIS — Z6841 Body Mass Index (BMI) 40.0 and over, adult: Secondary | ICD-10-CM

## 2017-10-03 MED ORDER — NA SULFATE-K SULFATE-MG SULF 17.5-3.13-1.6 GM/177ML PO SOLN: ORAL | 0 refills | Status: DC

## 2017-10-03 NOTE — Progress Notes (Signed)
Patient denies any allergies to eggs or soy. Patient denies any problems with anesthesia/sedation. Post-op n&v. Patient denies any oxygen use at home. Patient denies taking any diet/weight loss medications or blood thinners. EMMI education assisgned to patient on colonoscopy, this was explained and instructions given to patient. Suprep coupon printed and given to pt.

## 2017-10-18 ENCOUNTER — Encounter: Payer: Self-pay | Admitting: Gastroenterology

## 2017-10-18 ENCOUNTER — Ambulatory Visit (AMBULATORY_SURGERY_CENTER): Payer: 59 | Admitting: Gastroenterology

## 2017-10-18 VITALS — BP 151/79 | HR 80 | Temp 98.4°F | Resp 18 | Ht 64.0 in | Wt 230.0 lb

## 2017-10-18 DIAGNOSIS — Z1211 Encounter for screening for malignant neoplasm of colon: Secondary | ICD-10-CM

## 2017-10-18 MED ORDER — SODIUM CHLORIDE 0.9 % IV SOLN: 500.0000 mL | Freq: Once | INTRAVENOUS | Status: DC

## 2017-10-18 NOTE — Op Note (Signed)
[facility] Patient Name: Stephanie Hunter Procedure Date: 10/18/2017 1:20 PM MRN: [id number] Endoscopist: Mauri Pole , MD Age: 52 Referring MD:  Date of Birth: 07-01-1965 Gender: Female Account #: [id number] Procedure:                Colonoscopy Indications:              Screening for colorectal malignant neoplasm Medicines:                Monitored Anesthesia Care Procedure:                Pre-Anesthesia Assessment:                           - Prior to the procedure, a History and Physical                            was performed, and patient medications and                            allergies were reviewed. The patient's tolerance of                            previous anesthesia was also reviewed. The risks                            and benefits of the procedure and the sedation                            options and risks were discussed with the patient.                            All questions were answered, and informed consent                            was obtained. Prior Anticoagulants: The patient has                            taken no previous anticoagulant or antiplatelet                            agents. ASA Grade Assessment: II - A patient with                            mild systemic disease. After reviewing the risks                            and benefits, the patient was deemed in                            satisfactory condition to undergo the procedure.                           After obtaining informed consent, the colonoscope  was passed under direct vision. Throughout the                            procedure, the patient's blood pressure, pulse, and                            oxygen saturations were monitored continuously. The                            Model PCF-[id number]) scope was introduced                            through the anus and advanced to the the cecum,                            identified  by appendiceal orifice and ileocecal                            valve. The colonoscopy was performed without                            difficulty. The patient tolerated the procedure                            well. The quality of the bowel preparation was                            excellent. The ileocecal valve, appendiceal                            orifice, and rectum were photographed. Scope In: 1:25:05 PM Scope Out: 1:39:59 PM Scope Withdrawal Time: 0 hours 7 minutes 19 seconds  Total Procedure Duration: 0 hours 14 minutes 54 seconds  Findings:                 The perianal and digital rectal examinations were                            normal.                           Non-bleeding internal hemorrhoids were found during                            retroflexion. The hemorrhoids were medium-sized.                           A few small-mouthed diverticula were found in the                            sigmoid colon and ascending colon. Complications:            No immediate complications. Estimated Blood Loss:     Estimated blood loss: none. Impression:               - Non-bleeding internal hemorrhoids.                           -  Diverticulosis in the sigmoid colon and in the                            ascending colon.                           - No specimens collected. Recommendation:           - Patient has a contact number available for                            emergencies. The signs and symptoms of potential                            delayed complications were discussed with the                            patient. Return to normal activities tomorrow.                            Written discharge instructions were provided to the                            patient.                           - Resume previous diet.                           - Continue present medications.                           - Repeat colonoscopy in 10 years for screening                             purposes. Mauri Pole, MD 10/18/2017 1:47:41 PM This report has been signed electronically.

## 2017-10-18 NOTE — Patient Instructions (Signed)
  Please read handouts on hemorrhoids and diverticulosis. Continue present medications.    YOU HAD AN ENDOSCOPIC PROCEDURE TODAY AT [facility]:   Refer to the procedure report that was given to you for any specific questions about what was found during the examination.  If the procedure report does not answer your questions, please call your gastroenterologist to clarify.  If you requested that your care partner not be given the details of your procedure findings, then the procedure report has been included in a sealed envelope for you to review at your convenience later.  YOU SHOULD EXPECT: Some feelings of bloating in the abdomen. Passage of more gas than usual.  Walking can help get rid of the air that was put into your GI tract during the procedure and reduce the bloating. If you had a lower endoscopy (such as a colonoscopy or flexible sigmoidoscopy) you may notice spotting of blood in your stool or on the toilet paper. If you underwent a bowel prep for your procedure, you may not have a normal bowel movement for a few days.  Please Note:  You might notice some irritation and congestion in your nose or some drainage.  This is from the oxygen used during your procedure.  There is no need for concern and it should clear up in a day or so.  SYMPTOMS TO REPORT IMMEDIATELY:   Following lower endoscopy (colonoscopy or flexible sigmoidoscopy):  Excessive amounts of blood in the stool  Significant tenderness or worsening of abdominal pains  Swelling of the abdomen that is new, acute  Fever of 100F or higher    For urgent or emergent issues, a gastroenterologist can be reached at any hour by calling [phone].   DIET:  We do recommend a small meal at first, but then you may proceed to your regular diet.  Drink plenty of fluids but you should avoid alcoholic beverages for 24 hours.  ACTIVITY:  You should plan to take it easy for the rest of today and you should NOT DRIVE  or use heavy machinery until tomorrow (because of the sedation medicines used during the test).    FOLLOW UP: Our staff will call the number listed on your records the next business day following your procedure to check on you and address any questions or concerns that you may have regarding the information given to you following your procedure. If we do not reach you, we will leave a message.  However, if you are feeling well and you are not experiencing any problems, there is no need to return our call.  We will assume that you have returned to your regular daily activities without incident.  If any biopsies were taken you will be contacted by phone or by letter within the next 1-3 weeks.  Please call us at [phone] if you have not heard about the biopsies in 3 weeks.    SIGNATURES/CONFIDENTIALITY: You and/or your care partner have signed paperwork which will be entered into your electronic medical record.  These signatures attest to the fact that that the information above on your After Visit Summary has been reviewed and is understood.  Full responsibility of the confidentiality of this discharge information lies with you and/or your care-partner.
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
## 2017-10-18 NOTE — Progress Notes (Signed)
Report to PACU, RN, vss, BBS= Clear.  

## 2017-10-21 ENCOUNTER — Telehealth: Payer: Self-pay | Admitting: *Deleted

## 2017-10-21 ENCOUNTER — Telehealth: Payer: Self-pay

## 2017-10-21 NOTE — Telephone Encounter (Signed)
NO ANSWER, MESSAGE LEFT FOR PATIENT. 

## 2017-10-21 NOTE — Telephone Encounter (Signed)
  Follow up Call-  Call back number 10/18/2017  Post procedure Call Back phone  # [phone]  Permission to leave phone message Yes  Some recent data might be hidden     Patient questions:  Do you have a fever, pain , or abdominal swelling? No. Pain Score  0 *  Have you tolerated food without any problems? Yes.    Have you been able to return to your normal activities? Yes.    Do you have any questions about your discharge instructions: Diet   No. Medications  No. Follow up visit  No.  Do you have questions or concerns about your Care? No.  Actions: * If pain score is 4 or above: No action needed, pain <4.

## 2018-01-22 ENCOUNTER — Other Ambulatory Visit: Payer: Self-pay

## 2018-01-22 ENCOUNTER — Inpatient Hospital Stay (HOSPITAL_BASED_OUTPATIENT_CLINIC_OR_DEPARTMENT_OTHER)
Admission: EM | Admit: 2018-01-22 | Discharge: 2018-01-25 | DRG: 580 | Disposition: A | Discharge status: Home / Self Care | Payer: 59 | Attending: Internal Medicine | Admitting: Internal Medicine

## 2018-01-22 ENCOUNTER — Encounter (HOSPITAL_BASED_OUTPATIENT_CLINIC_OR_DEPARTMENT_OTHER): Payer: Self-pay

## 2018-01-22 DIAGNOSIS — Z803 Family history of malignant neoplasm of breast: Secondary | ICD-10-CM

## 2018-01-22 DIAGNOSIS — Z888 Allergy status to other drugs, medicaments and biological substances status: Secondary | ICD-10-CM

## 2018-01-22 DIAGNOSIS — D649 Anemia, unspecified: Secondary | ICD-10-CM

## 2018-01-22 DIAGNOSIS — L03213 Periorbital cellulitis: Principal | ICD-10-CM | POA: Diagnosis present

## 2018-01-22 DIAGNOSIS — Z801 Family history of malignant neoplasm of trachea, bronchus and lung: Secondary | ICD-10-CM

## 2018-01-22 DIAGNOSIS — Z6839 Body mass index (BMI) 39.0-39.9, adult: Secondary | ICD-10-CM

## 2018-01-22 DIAGNOSIS — L0201 Cutaneous abscess of face: Secondary | ICD-10-CM | POA: Diagnosis present

## 2018-01-22 DIAGNOSIS — D75839 Thrombocytosis, unspecified: Secondary | ICD-10-CM

## 2018-01-22 DIAGNOSIS — D473 Essential (hemorrhagic) thrombocythemia: Secondary | ICD-10-CM

## 2018-01-22 DIAGNOSIS — N920 Excessive and frequent menstruation with regular cycle: Secondary | ICD-10-CM | POA: Diagnosis present

## 2018-01-22 DIAGNOSIS — Z79899 Other long term (current) drug therapy: Secondary | ICD-10-CM

## 2018-01-22 DIAGNOSIS — L03211 Cellulitis of face: Secondary | ICD-10-CM | POA: Diagnosis present

## 2018-01-22 DIAGNOSIS — L0390 Cellulitis, unspecified: Secondary | ICD-10-CM | POA: Diagnosis present

## 2018-01-22 DIAGNOSIS — Z91040 Latex allergy status: Secondary | ICD-10-CM

## 2018-01-22 DIAGNOSIS — R7989 Other specified abnormal findings of blood chemistry: Secondary | ICD-10-CM | POA: Diagnosis present

## 2018-01-22 DIAGNOSIS — D259 Leiomyoma of uterus, unspecified: Secondary | ICD-10-CM | POA: Diagnosis present

## 2018-01-22 DIAGNOSIS — D500 Iron deficiency anemia secondary to blood loss (chronic): Secondary | ICD-10-CM | POA: Diagnosis present

## 2018-01-22 DIAGNOSIS — R220 Localized swelling, mass and lump, head: Secondary | ICD-10-CM | POA: Diagnosis not present

## 2018-01-22 DIAGNOSIS — Z800 Family history of malignant neoplasm of digestive organs: Secondary | ICD-10-CM

## 2018-01-22 LAB — BASIC METABOLIC PANEL
Anion gap: 10 (ref 5–15)
BUN: 13 mg/dL (ref 6–20)
CO2: 22 mmol/L (ref 22–32)
Calcium: 8.7 mg/dL — ABNORMAL LOW (ref 8.9–10.3)
Chloride: 103 mmol/L (ref 98–111)
Creatinine, Ser: 0.96 mg/dL (ref 0.44–1.00)
GFR calc Af Amer: >60 mL/min (ref >60)
GFR calc non Af Amer: >60 mL/min (ref >60)
Glucose, Bld: 130 mg/dL — ABNORMAL HIGH (ref 70–99)
Potassium: 3.5 mmol/L (ref 3.5–5.1)
Sodium: 135 mmol/L (ref 135–145)

## 2018-01-22 LAB — CBC WITH DIFFERENTIAL/PLATELET
Abs Immature Granulocytes: 0.03 10*3/uL (ref 0.00–0.07)
Basophils Absolute: 0 10*3/uL (ref 0.0–0.1)
Basophils Relative: 0 %
Eosinophils Absolute: 0.2 10*3/uL (ref 0.0–0.5)
Eosinophils Relative: 2 %
HCT: 36.1 % (ref 36.0–46.0)
Hemoglobin: 11 g/dL — ABNORMAL LOW (ref 12.0–15.0)
Immature Granulocytes: 0 %
Lymphocytes Relative: 25 %
Lymphs Abs: 2.3 10*3/uL (ref 0.7–4.0)
MCH: 24.8 pg — ABNORMAL LOW (ref 26.0–34.0)
MCHC: 30.5 g/dL (ref 30.0–36.0)
MCV: 81.3 fL (ref 80.0–100.0)
Monocytes Absolute: 0.6 10*3/uL (ref 0.1–1.0)
Monocytes Relative: 6 %
Neutro Abs: 6.3 10*3/uL (ref 1.7–7.7)
Neutrophils Relative %: 67 %
Platelets: 548 10*3/uL — ABNORMAL HIGH (ref 150–400)
RBC: 4.44 MIL/uL (ref 3.87–5.11)
RDW: 15.9 % — ABNORMAL HIGH (ref 11.5–15.5)
WBC: 9.4 10*3/uL (ref 4.0–10.5)
nRBC: 0 % (ref 0.0–0.2)

## 2018-01-22 LAB — I-STAT CG4 LACTIC ACID, ED: Lactic Acid, Venous: 1.11 mmol/L (ref 0.5–1.9)

## 2018-01-22 MED ORDER — VANCOMYCIN HCL IN DEXTROSE 1-5 GM/200ML-% IV SOLN: 1000.0000 mg | Freq: Once | INTRAVENOUS | Status: DC

## 2018-01-22 MED ORDER — VANCOMYCIN HCL 10 G IV SOLR
2000.0000 mg | Freq: Once | INTRAVENOUS | Status: AC
  Administered 2018-01-22: 2000 mg via INTRAVENOUS
  Filled 2018-01-22: qty 2000

## 2018-01-22 MED ORDER — SODIUM CHLORIDE 0.9 % IV SOLN
INTRAVENOUS | Status: DC | PRN
  Administered 2018-01-22: 250 mL via INTRAVENOUS
  Administered 2018-01-23: 1000 mL via INTRAVENOUS

## 2018-01-22 MED ORDER — LIDOCAINE-EPINEPHRINE (PF) 1 %-1:200000 IJ SOLN
10.0000 mL | Freq: Once | INTRAMUSCULAR | Status: AC
  Administered 2018-01-22: 10 mL via INTRADERMAL

## 2018-01-22 MED ORDER — VANCOMYCIN HCL 1000 MG IV SOLR
INTRAVENOUS | Status: AC
  Filled 2018-01-22: qty 2000

## 2018-01-22 MED ORDER — LIDOCAINE-EPINEPHRINE 2 %-1:100000 IJ SOLN
20.0000 mL | Freq: Once | INTRAMUSCULAR | Status: DC
  Filled 2018-01-22: qty 20

## 2018-01-22 MED ORDER — LIDOCAINE-EPINEPHRINE (PF) 1 %-1:200000 IJ SOLN
INTRAMUSCULAR | Status: AC
  Administered 2018-01-22: 10 mL via INTRADERMAL
  Filled 2018-01-22: qty 10

## 2018-01-22 NOTE — ED Provider Notes (Signed)
[facility] EMERGENCY DEPARTMENT Provider Note   CSN: [id number] Arrival date & time: 01/22/18  2206     History   Chief Complaint Chief Complaint  Patient presents with  . Cellulitis    HPI Dellene Mcgroarty is a 52 y.o. female with history of uterine fibroids, allergies, anemia presents for evaluation of acute onset, progressively worsening facial swelling since yesterday.  She states that she awoke yesterday morning with a "bump" to her forehead with some swelling to the forehead.  She went to her PCPs office and underwent I&D of the area.  Wound culture was obtained and she was given a dose of Rocephin in the office and sent home with Bactrim.  Since then she has had progressively worsening right-sided facial swelling extending into the eyelids and cheek.  She went to her PCP again earlier today for further evaluation and a CT was obtained which showed right forehead abscess measuring approximately 12 mm with forehead and facial cellulitis most pronounced in the periorbital region on the right.  No evidence of drainable abscess at that site.  Her PCP recommended presentation to the ED for IV antibiotics and possible admission.  The patient initially declined presentation to the ED but noted even worsening swelling extending to the left side of the nose and left cheek in the past 2-3 hours. The patient notes some intermittent lightheadedness but denies fevers, chills, nausea, vomiting, abdominal pain, chest pain, shortness of breath.  She denies eye pain, pain with eye movements, vision changes.  She does not wear contact lenses.  She has had 4 doses of Bactrim at home.    The history is provided by the patient.    Past Medical History:  Diagnosis Date  . Allergy   . Anemia   . Blood transfusion without reported diagnosis   . Fibroid, uterine   . Pneumonia 2017  . PONV (postoperative nausea and vomiting)     Patient Active Problem List   Diagnosis Date Noted  . Facial  cellulitis 01/22/2018  . Morbid obesity with BMI of 40.0-44.9, adult ([place]) 10/03/2017  . Trichomonas infection 10/03/2017  . Cervical high risk HPV (human papillomavirus) test positive 10/03/2017  . Anemia 10/03/2017  . Hemorrhoids 10/03/2017  . Menorrhagia 10/03/2017  . Morbid obesity ([place]) 10/03/2017  . Uterine leiomyoma 10/03/2017  . Abnormal TSH 07/04/2015  . History of iron deficiency 07/04/2015  . Abnormal cervical Papanicolaou smear 03/12/1998    Past Surgical History:  Procedure Laterality Date  . MYOMECTOMY    . polyp removed from ovary        OB History    Gravida  1   Para  0   Term  0   Preterm  0   AB  1   Living  0     SAB  1   TAB  0   Ectopic  0   Multiple  0   Live Births               Home Medications    Prior to Admission medications   Medication Sig Start Date End Date Taking? Authorizing Provider  Ascorbic Acid (VITAMIN C PO) Take 1 tablet by mouth daily.    [provider]  Coenzyme Q10 (CO Q 10) 10 MG CAPS Take by mouth daily.    [provider]  Ferrous Sulfate (FE TABS PO) Take by mouth daily.    [provider]  Multiple Vitamin (MULTIVITAMIN) capsule Take 1 capsule by mouth  daily.    [provider]  norethindrone-ethinyl estradiol (LOESTRIN 1/20, 21,) 1-20 MG-MCG tablet Take 1 tablet by mouth daily. 02/26/12   Ena Dawley, MD  Vitamin D, Ergocalciferol, (DRISDOL) 50000 UNITS CAPS Take 50,000 Units by mouth every 7 (seven) days. On Thursdays    [provider]    Family History Family History  Problem Relation Age of Onset  . Breast cancer Mother   . Lung cancer Father   . Colon cancer Maternal Aunt        80's  . Esophageal cancer Neg Hx   . Rectal cancer Neg Hx   . Stomach cancer Neg Hx     Social History Social History   Tobacco Use  . Smoking status: Never Smoker  . Smokeless tobacco: Never Used  Substance Use Topics  . Alcohol use: Yes    Comment: occ.     . Drug use: No     Allergies   Latex and Adhesive [tape]   Review of Systems Review of Systems  HENT: Positive for facial swelling. Negative for trouble swallowing.   Eyes: Negative for pain, discharge, redness and visual disturbance.  Respiratory: Negative for shortness of breath.   Cardiovascular: Negative for chest pain.  Gastrointestinal: Negative for abdominal pain, nausea and vomiting.  Neurological: Positive for light-headedness. Negative for syncope.  All other systems reviewed and are negative.    Physical Exam Updated Vital Signs BP 127/68   Pulse 88   Temp 100.2 F (37.9 C) (Oral)   Resp 16   Ht 5\' 4"  (1.626 m)   Wt 105.6 kg   LMP 01/01/2018 (Approximate)   SpO2 99%   BMI 39.96 kg/m   Physical Exam  Constitutional: She appears well-developed and well-nourished. No distress.  HENT:  Head: Atraumatic.  See below images.  Patient with a once a meter area of fluctuance 1 cm area of swelling and fluctuance with surrounding erythema and induration to the right forehead.  No active drainage.Tender to palpation.  There is right-sided facial swelling extending to the dorsum of the nose into the left cheek.  Eyes: Pupils are equal, round, and reactive to light. Conjunctivae and EOM are normal. Right eye exhibits no discharge. Left eye exhibits no discharge.  Swelling and warmth to the right upper and lower eyelids.  No chemosis, proptosis, or consensual photophobia.  No pain with eye movements.  Neck: Normal range of motion. Neck supple. No JVD present. No tracheal deviation present.  Cardiovascular: Regular rhythm and normal heart sounds.  Mildly tachycardic  Pulmonary/Chest: Effort normal and breath sounds normal.  Abdominal: Soft. Bowel sounds are normal. She exhibits no distension. There is no tenderness. There is no guarding.  Musculoskeletal: She exhibits no edema.  Lymphadenopathy:    She has no cervical adenopathy.  Neurological: She is alert.  Skin: Skin  is warm and dry. No erythema.  Psychiatric: She has a normal mood and affect. Her behavior is normal.  Nursing note and vitals reviewed.        ED Treatments / Results  Labs (all labs ordered are listed, but only abnormal results are displayed) Labs Reviewed  CBC WITH DIFFERENTIAL/PLATELET - Abnormal; Notable for the following components:      Result Value   Hemoglobin 11.0 (*)    MCH 24.8 (*)    RDW 15.9 (*)    Platelets 548 (*)    All other components within normal limits  BASIC METABOLIC PANEL - Abnormal; Notable for the following components:  Glucose, Bld 130 (*)    Calcium 8.7 (*)    All other components within normal limits  I-STAT CG4 LACTIC ACID, ED  I-STAT CG4 LACTIC ACID, ED    EKG None  Radiology No results found.  Procedures .Marland KitchenIncision and Drainage Date/Time: 01/22/2018 11:35 PM Performed by: Renita Papa, PA-C Authorized by: Renita Papa, PA-C   Consent:    Consent obtained:  Verbal   Consent given by:  Patient   Risks discussed:  Bleeding, incomplete drainage, pain, damage to other organs and infection   Alternatives discussed:  No treatment Universal protocol:    Procedure explained and questions answered to patient or proxy's satisfaction: yes     Relevant documents present and verified: yes     Test results available and properly labeled: yes     Imaging studies available: yes     Required blood products, implants, devices, and special equipment available: yes     Site/side marked: yes     Immediately prior to procedure a time out was called: yes     Patient identity confirmed:  Verbally with patient Location:    Type:  Abscess   Size:  5mm   Location:  Head   Head location:  Face (forehead) Pre-procedure details:    Skin preparation:  Betadine Anesthesia (see MAR for exact dosages):    Anesthesia method:  Local infiltration   Local anesthetic:  Lidocaine 2% WITH epi Procedure type:    Complexity:  Complex Procedure details:     Incision types:  Elliptical   Incision depth:  Subcutaneous   Scalpel blade:  11   Wound management:  Probed and deloculated, irrigated with saline and extensive cleaning   Drainage:  Purulent   Drainage amount:  Moderate   Wound treatment:  Wound left open Post-procedure details:    Patient tolerance of procedure:  Tolerated well, no immediate complications   (including critical care time)  Medications Ordered in ED Medications  vancomycin (VANCOCIN) 2,000 mg in sodium chloride 0.9 % 500 mL IVPB (2,000 mg Intravenous New Bag/Given 01/22/18 2309)  vancomycin (VANCOCIN) 1000 MG powder (  Canceled Entry 01/22/18 2300)  0.9 %  sodium chloride infusion (250 mLs Intravenous New Bag/Given 01/22/18 2308)  lidocaine-EPINEPHrine (XYLOCAINE-EPINEPHrine) 1 %-1:200000 (PF) injection 10 mL (10 mLs Intradermal Given by Other 01/22/18 2312)     Initial Impression / Assessment and Plan / ED Course  I have reviewed the triage vital signs and the nursing notes.  Pertinent labs & imaging results that were available during my care of the patient were reviewed by me and considered in my medical decision making (see chart for details).     Patient with worsening forehead abscess and facial cellulitis despite I&D yesterday and antibiotic therapy.  She has had IM Rocephin and 4 doses of Bactrim without improvement in her symptoms.  She is borderline febrile, tachycardic and hypertensive in the ED.  CT scan performed at outside facility today shows facial cellulitis worst in the periorbital region.  No evidence of nerve entrapment or orbital cellulitis.  She underwent a second I&D here in the ED with significant purulent discharge and removal of a cyst-like structure.  Wound left open.  Lab work shows no leukocytosis, mild anemia.  No metabolic derangements.  Lactate within normal limits.  She does not appear to be septic at this time.  She received IV fluids and vancomycin in the ED.  With rapid progression of  cellulitis and recurrence of abscess feel  she would benefit from admission to the hospital for further evaluation and management.  Spoke with Dr. Marlowe Sax with [facility] who agrees to assume care of patient and bring her into the hospital.  She will be transferred to [facility].  Patient seen and evaluated by Dr. Alvino Chapel who agrees with assessment and plan at this time. Final Clinical Impressions(s) / ED Diagnoses   Final diagnoses:  Facial cellulitis  Abscess of forehead    ED Discharge Orders    None       Debroah Baller 01/23/18 0019    Davonna Belling, MD 01/25/18 [phone]

## 2018-01-22 NOTE — Progress Notes (Signed)
Pharmacy Antibiotic Note  Stephanie Hunter is a 52 y.o. female admitted on 01/22/2018 with cellulitis.  Pharmacy has been consulted for vancomycin dosing. Tmax 100.2, labs pending.  Plan: Vancomycin 2g IV x 1 Monitor clinical progress, c/s, renal function F/u de-escalation plan/LOT, vancomycin trough as indicated F/u SCr to enter vancomycin maintenance doses  Height: 5\' 4"  (162.6 cm) Weight: 232 lb 12.8 oz (105.6 kg) IBW/kg (Calculated) : 54.7  Temp (24hrs), Avg:100.2 F (37.9 C), Min:100.2 F (37.9 C), Max:100.2 F (37.9 C)  No results for input(s): WBC, CREATININE, LATICACIDVEN, VANCOTROUGH, VANCOPEAK, VANCORANDOM, GENTTROUGH, GENTPEAK, GENTRANDOM, TOBRATROUGH, TOBRAPEAK, TOBRARND, AMIKACINPEAK, AMIKACINTROU, AMIKACIN in the last 168 hours.  CrCl cannot be calculated (Patient's most recent lab result is older than the maximum 21 days allowed.).    Allergies  Allergen Reactions  . Latex Hives  . Adhesive [Tape] Rash    Elicia Lamp, PharmD, BCPS Please check AMION for all [place] contact numbers Clinical Pharmacist 01/22/2018 10:40 PM

## 2018-01-22 NOTE — ED Triage Notes (Signed)
Pt with facial swelling x 2 days-abscess like area to forehead x 1-2 weeks-rx abd and CT today-was advised to come to ED if swelling worse-pt feels facial swelling is worse

## 2018-01-23 DIAGNOSIS — R220 Localized swelling, mass and lump, head: Secondary | ICD-10-CM | POA: Diagnosis present

## 2018-01-23 DIAGNOSIS — Z79899 Other long term (current) drug therapy: Secondary | ICD-10-CM | POA: Diagnosis not present

## 2018-01-23 DIAGNOSIS — Z803 Family history of malignant neoplasm of breast: Secondary | ICD-10-CM | POA: Diagnosis not present

## 2018-01-23 DIAGNOSIS — D649 Anemia, unspecified: Secondary | ICD-10-CM | POA: Diagnosis not present

## 2018-01-23 DIAGNOSIS — Z888 Allergy status to other drugs, medicaments and biological substances status: Secondary | ICD-10-CM | POA: Diagnosis not present

## 2018-01-23 DIAGNOSIS — Z801 Family history of malignant neoplasm of trachea, bronchus and lung: Secondary | ICD-10-CM | POA: Diagnosis not present

## 2018-01-23 DIAGNOSIS — L0201 Cutaneous abscess of face: Secondary | ICD-10-CM

## 2018-01-23 DIAGNOSIS — Z6839 Body mass index (BMI) 39.0-39.9, adult: Secondary | ICD-10-CM | POA: Diagnosis not present

## 2018-01-23 DIAGNOSIS — D473 Essential (hemorrhagic) thrombocythemia: Secondary | ICD-10-CM | POA: Diagnosis not present

## 2018-01-23 DIAGNOSIS — D75839 Thrombocytosis, unspecified: Secondary | ICD-10-CM

## 2018-01-23 DIAGNOSIS — L03211 Cellulitis of face: Secondary | ICD-10-CM | POA: Diagnosis present

## 2018-01-23 DIAGNOSIS — Z800 Family history of malignant neoplasm of digestive organs: Secondary | ICD-10-CM | POA: Diagnosis not present

## 2018-01-23 DIAGNOSIS — Z91048 Other nonmedicinal substance allergy status: Secondary | ICD-10-CM | POA: Diagnosis not present

## 2018-01-23 DIAGNOSIS — D259 Leiomyoma of uterus, unspecified: Secondary | ICD-10-CM | POA: Diagnosis not present

## 2018-01-23 DIAGNOSIS — Z91040 Latex allergy status: Secondary | ICD-10-CM | POA: Diagnosis not present

## 2018-01-23 DIAGNOSIS — R7989 Other specified abnormal findings of blood chemistry: Secondary | ICD-10-CM | POA: Diagnosis not present

## 2018-01-23 DIAGNOSIS — L03213 Periorbital cellulitis: Secondary | ICD-10-CM | POA: Diagnosis present

## 2018-01-23 DIAGNOSIS — L0390 Cellulitis, unspecified: Secondary | ICD-10-CM | POA: Diagnosis present

## 2018-01-23 DIAGNOSIS — D500 Iron deficiency anemia secondary to blood loss (chronic): Secondary | ICD-10-CM | POA: Diagnosis not present

## 2018-01-23 DIAGNOSIS — N920 Excessive and frequent menstruation with regular cycle: Secondary | ICD-10-CM | POA: Diagnosis not present

## 2018-01-23 LAB — HIV ANTIBODY (ROUTINE TESTING W REFLEX): HIV SCREEN 4TH GENERATION: NONREACTIVE

## 2018-01-23 MED ORDER — VANCOMYCIN HCL 10 G IV SOLR: 1500.0000 mg | INTRAVENOUS | Status: DC

## 2018-01-23 MED ORDER — ADULT MULTIVITAMIN W/MINERALS CH
1.0000 | ORAL_TABLET | Freq: Every day | ORAL | Status: DC
  Administered 2018-01-23 – 2018-01-25 (×3): 1 via ORAL
  Filled 2018-01-23 (×3): qty 1

## 2018-01-23 MED ORDER — SODIUM CHLORIDE 0.9 % IV SOLN
INTRAVENOUS | Status: AC
  Administered 2018-01-23: 08:00:00 via INTRAVENOUS

## 2018-01-23 MED ORDER — ENOXAPARIN SODIUM 40 MG/0.4ML ~~LOC~~ SOLN: 40.0000 mg | SUBCUTANEOUS | Status: DC

## 2018-01-23 MED ORDER — VANCOMYCIN HCL 10 G IV SOLR
1250.0000 mg | Freq: Two times a day (BID) | INTRAVENOUS | Status: DC
  Administered 2018-01-23: 1250 mg via INTRAVENOUS
  Filled 2018-01-23: qty 1250

## 2018-01-23 MED ORDER — VANCOMYCIN HCL 10 G IV SOLR: 1250.0000 mg | INTRAVENOUS | Status: DC

## 2018-01-23 MED ORDER — ACETAMINOPHEN 325 MG PO TABS
650.0000 mg | ORAL_TABLET | Freq: Four times a day (QID) | ORAL | Status: DC | PRN
  Administered 2018-01-23 – 2018-01-25 (×5): 650 mg via ORAL
  Filled 2018-01-23 (×5): qty 2

## 2018-01-23 MED ORDER — VITAMIN D3 25 MCG (1000 UNIT) PO TABS
1000.0000 [IU] | ORAL_TABLET | Freq: Every day | ORAL | Status: DC
  Administered 2018-01-23 – 2018-01-25 (×3): 1000 [IU] via ORAL
  Filled 2018-01-23 (×3): qty 1

## 2018-01-23 MED ORDER — ACETAMINOPHEN 650 MG RE SUPP: 650.0000 mg | Freq: Four times a day (QID) | RECTAL | Status: DC | PRN

## 2018-01-23 MED ORDER — ENOXAPARIN SODIUM 60 MG/0.6ML ~~LOC~~ SOLN
0.5000 mg/kg | SUBCUTANEOUS | Status: DC
  Filled 2018-01-23 (×2): qty 0.6

## 2018-01-23 MED ORDER — DROSPIRENONE-ETHINYL ESTRADIOL 3-0.02 MG PO TABS
1.0000 | ORAL_TABLET | Freq: Every day | ORAL | Status: DC
  Administered 2018-01-25: 1 via ORAL

## 2018-01-23 MED ORDER — FERROUS SULFATE 325 (65 FE) MG PO TABS
325.0000 mg | ORAL_TABLET | Freq: Every day | ORAL | Status: DC
  Administered 2018-01-23 – 2018-01-25 (×3): 325 mg via ORAL
  Filled 2018-01-23 (×3): qty 1

## 2018-01-23 MED ORDER — MUPIROCIN CALCIUM 2 % EX CREA
TOPICAL_CREAM | Freq: Every day | CUTANEOUS | Status: DC
  Administered 2018-01-23 – 2018-01-25 (×3): via TOPICAL
  Filled 2018-01-23: qty 15

## 2018-01-23 NOTE — Consult Note (Addendum)
[facility] Nurse wound consult note Reason for Consult: Consult requested for forehead wound.  Pt had an I&D performed to this location in the ER earlier, according to the EMR,  and has a full thickness wound where the procedure was performed. Wound type: Full thickness to right upper forehead; .2X.2X.1cm, generalized edema surrounding the site. Wound bed: yellow slightly raised wound bed, small amt blood-tinged drainage, no odor Periwound: intact skin surrounding Dressing procedure/placement/frequency: There is no depth for packing; apply Bactroban daily to provide antimicrobial benefits and band-aid to protect from further injury and absorb drainage. Please re-consult if further assistance is needed.  Thank-you,  Julien Girt MSN, [place], [place], [place], [place]

## 2018-01-23 NOTE — Progress Notes (Addendum)
Attending MD note  Patient was seen, examined,treatment plan was discussed with the PA-S.  I have personally reviewed the clinical findings, lab, imaging studies and management of this patient in detail. I agree with the documentation, as recorded by the PA-S  Stephanie Hunter is a 52 y.o. year old female with medical history significant for  Uterine fibroids and anemia who presented on 01/22/2018 with worsening swelling over right forehead and was found to have forehead abscess and right periorbital cellulitis after failed outpatient treatment with Bactrim.    Physical: Vitals:   01/23/18 0214 01/23/18 0541  BP: (!) 141/65 126/63  Pulse: 85 82  Resp: 18 15  Temp: 98.5 F (36.9 C) 98.3 F (36.8 C)  SpO2: 99% 100%    Constitutional:normal appearing female Eyes: EOMI, anicteric, normal conjunctivae, swelling of right skin surrounding orbit and right above eyelid, minimal swelling of left eyelid, no drainage no erythema, nontender to touch. ENMT: Oropharynx with moist mucous membranes, normal dentition Cardiovascular: RRR no MRGs, with no peripheral edema Respiratory: Normal respiratory effort, clear breath sounds  Abdomen: Soft,non-tender, with no HSM Neurologic: Grossly no focal neuro deficit. Psychiatric:Appropriate affect, and mood. Mental status AAOx3  Plan  1. Nonpurulent periorbital cellulitis of right eye.  Failed outpatient treatment with Bactrim (status post 1 dose of IV ceftriaxone as outpatient) and now requiring IV antibiotics that necessitate change from observation status to inpatient admission.  Empirically treated with vancomycin on admission however has remained hemodynamically stable and no signs of sepsis so doubt true MRSA.  ID consulted will DC Vanco and start IV cefazolin as more likely strep infection and plan to transition to Keflex if continues to remain stable in next 24 hours.  CT scan negative for any septal cellulitis 2. Right forehead abscess, status post  I&D in ED.  Following wound care recommendations. 3. Thrombocytosis, chronic. Stable at baseline  Disposition. Meets inpatient criteria given failed outpatient antibiotics and need for IV antibiotics for treatment at least for 48 hours will change to inpatient   Rest as below  Desiree Hane [facility]    PROGRESS NOTE    Anara Cowman  NAT:[id number] DOB: [birth year] DOA: 01/22/2018 PCP: Bartholome Bill, MD (Confirm with patient/family/NH records and if not entered, this HAS to be entered at [facility] point of entry. "No PCP" if truly none.) Outpatient Specialists: (Specify speciality and name if known)    Brief Narrative: Stephanie Hunter is a 66 y.o patient with a history of uterine fibroids and anemia. She presented with right side forehead abscess and periorbital cellulitis. The abscess on her forehead started as a simple pimple two weeks ago and has been getting bigger since then. Patient indicated that the swelling on the right side of her face as well as periorbital area started two days. She went to see her PCP two days ago who drained the abscess and treat her with antibiotics. The CT done in the PCP office shows an abscess of 12 mm on forehead and facial cellulitis.  Patient came to the ED yesterday as the swelling on the right side of her face has not get better and as it started moving to the left side of her face as well. In the ED, the abscess was drained for a second time and she was placed on IV vancomycin and admitted to the hospital for further evaluation and managment.   Assessment & Plan:  Facial non-purulent Cellulitis: Likely secondary to strep infection as she does not have purulent draining from  her eyes.                                                       D/C the vancomycin IV as there is no indication of MRSA infection.                                                       With ID consult, we decided to place her on cefazolin IV for the next two days  and send her home with Keflex PO.  Abscess of forehead: No change since I&D done in the ED. Per wound care consult, no packing needed and suggested to apply Bactroban daily.   Chronic anemia: due to menorrhagia and uterine fibroids. Currently Hgb 11 and MCV 81.3. No evaluation is needed.   Thrombocytosis:  Chronic condition. Has a platelet of 548,000. No evaluation is needed.     DVT prophylaxis: Lovenox Code Status:  full code. Family Communication: Patient friend was at bedside at time of interview. Disposition Plan: discharge home in two days if periorbital swelling improved.    Consultants: Wound care and Phone consult with ID.   Procedures: None Antimicrobials:   Subjective: Patient indicated that her swelling has been getting better since admission. No change in her vision or have blurry vision.    Objective: Vitals:   01/23/18 0001 01/23/18 0052 01/23/18 0214 01/23/18 0541  BP: 127/68 128/67 (!) 141/65 126/63  Pulse: 88 90 85 82  Resp: 16 16 18 15   Temp:  98.6 F (37 C) 98.5 F (36.9 C) 98.3 F (36.8 C)  TempSrc:  Oral Oral Oral  SpO2: 99% 100% 99% 100%  Weight:      Height:        Intake/Output Summary (Last 24 hours) at 01/23/2018 1243 Last data filed at 01/23/2018 0955 Gross per 24 hour  Intake 240 ml  Output -  Net 240 ml   Filed Weights   01/22/18 2217  Weight: 105.6 kg    Examination:  General exam: Appears calm and comfortable  Respiratory system: Clear to auscultation. Respiratory effort normal. Cardiovascular system: S1 & S2 heard, RRR. No JVD, murmurs, rubs, gallops or clicks. No pedal edema. Gastrointestinal system: Abdomen is nondistended, soft and nontender. No organomegaly or masses felt. Normal bowel sounds heard. Central nervous system: Alert and oriented. No focal neurological deficits. Extremities: Symmetric 5 x 5 power. Skin: lesion on right side of her forehead form I&D Psychiatry: Judgement and insight appear normal. Mood &  affect appropriate.     Data Reviewed: I have personally reviewed following labs and imaging studies  CBC: Recent Labs  Lab 01/22/18 2246  WBC 9.4  NEUTROABS 6.3  HGB 11.0*  HCT 36.1  MCV 81.3  PLT 563*   Basic Metabolic Panel: Recent Labs  Lab 01/22/18 2246  NA 135  K 3.5  CL 103  CO2 22  GLUCOSE 130*  BUN 13  CREATININE 0.96  CALCIUM 8.7*   GFR: Estimated Creatinine Clearance: 82.2 mL/min (by C-G formula based on SCr of 0.96 mg/dL). Liver Function Tests: No results for input(s): AST, ALT, ALKPHOS, BILITOT, PROT, ALBUMIN in the last 168 hours. No results  for input(s): LIPASE, AMYLASE in the last 168 hours. No results for input(s): AMMONIA in the last 168 hours. Coagulation Profile: No results for input(s): INR, PROTIME in the last 168 hours. Cardiac Enzymes: No results for input(s): CKTOTAL, CKMB, CKMBINDEX, TROPONINI in the last 168 hours. BNP (last 3 results) No results for input(s): PROBNP in the last 8760 hours. HbA1C: No results for input(s): HGBA1C in the last 72 hours. CBG: No results for input(s): GLUCAP in the last 168 hours. Lipid Profile: No results for input(s): CHOL, HDL, LDLCALC, TRIG, CHOLHDL, LDLDIRECT in the last 72 hours. Thyroid Function Tests: No results for input(s): TSH, T4TOTAL, FREET4, T3FREE, THYROIDAB in the last 72 hours. Anemia Panel: No results for input(s): VITAMINB12, FOLATE, FERRITIN, TIBC, IRON, RETICCTPCT in the last 72 hours. Urine analysis: No results found for: COLORURINE, APPEARANCEUR, LABSPEC, PHURINE, GLUCOSEU, HGBUR, BILIRUBINUR, KETONESUR, PROTEINUR, UROBILINOGEN, NITRITE, LEUKOCYTESUR Sepsis Labs: @LABRCNTIP (procalcitonin:4,lacticidven:4)  )No results found for this or any previous visit (from the past 240 hour(s)).       Radiology Studies: No results found.      Scheduled Meds: . cholecalciferol  1,000 Units Oral Daily  . drospirenone-ethinyl estradiol  1 tablet Oral Daily  . [START ON 01/24/2018]  enoxaparin (LOVENOX) injection  0.5 mg/kg Subcutaneous Q24H  . ferrous sulfate  325 mg Oral Q breakfast  . multivitamin with minerals  1 tablet Oral Daily  . mupirocin cream   Topical Daily   Continuous Infusions: . sodium chloride 250 mL (01/22/18 2308)  . sodium chloride 150 mL/hr at 01/23/18 0827  . [START ON 01/24/2018] vancomycin       LOS: 0 days    Time spent:    Sterling Big, MD [facility] Pager [phone]  If 7PM-7AM, please contact night-coverage [email or web address] Password [facility] 01/23/2018, 12:43 PM

## 2018-01-23 NOTE — ED Notes (Signed)
[facility] transported [facility] by [facility]

## 2018-01-23 NOTE — Progress Notes (Addendum)
Pharmacy Antibiotic Note  Stephanie Hunter is a 52 y.o. female admitted on 01/22/2018 with facial/forehead cellulitis/abscess s/p outpatient I/D 11/12 with ceftriaxone injection and started on Bactrim. Facial swelling worsened and patient presented to ED. Pharmacy has been consulted for vancomycin dosing.  Plan: Patient loaded with vancomycin 2000 mg iv once and started on 1250 mg iv q12h at [facility].   Will decrease vancomycin dosing to 1500 mg iv q 24h with predicted AUC 568 using IBW/ABW. Tolerating higher AUC to avoid 18h dosing. Will follow renal function closely.   Goal AUC 400-550.   F/U renal function, culture results  Height: 5\' 4"  (162.6 cm) Weight: 232 lb 12.8 oz (105.6 kg) IBW/kg (Calculated) : 54.7  Temp (24hrs), Avg:98.9 F (37.2 C), Min:98.3 F (36.8 C), Max:100.2 F (37.9 C)  Recent Labs  Lab 01/22/18 2246 01/22/18 2248  WBC 9.4  --   CREATININE 0.96  --   LATICACIDVEN  --  1.11    Estimated Creatinine Clearance: 82.2 mL/min (by C-G formula based on SCr of 0.96 mg/dL).    Allergies  Allergen Reactions  . Latex Hives  . Adhesive [Tape] Rash    Antimicrobials this admission: 11/13 vancomycin >>   Dose adjustments this admission: Vancomycin 1250 mg iv q 12 h> 1500 mg iv q 24h  Microbiology results: Outpatient Micro: 11/12 Wound cx: NGTD ([place], Phone# [phone])  Thank you for allowing pharmacy to be a part of this patient's care.  Ulice Dash D 01/23/2018 10:04 AM

## 2018-01-23 NOTE — H&P (Signed)
History and Physical    Stephanie Hunter ACZ:[id number] DOB: 12-21-1965 DOA: 01/22/2018  PCP: Bartholome Bill, MD Patient coming from: [place] [facility]  HPI: Stephanie Hunter is a 52 y.o. female with medical history significant of uterine fibroids, allergies, anemia presenting as a transfer from [place] for further management of facial cellulitis.  Patient was seen by her PCP November 12 and underwent I&D of the area.  Wound culture was obtained and she was given a dose of Rocephin in the office and sent home on Bactrim.  Her right-sided facial swelling worsened and extended into the eyelids and cheek.  She went back to her PCP November 13 for further evaluation and a CT was obtained which showed right forehead abscess measuring approximately 12 mm with forehead and facial cellulitis most pronounced in the periorbital region of on the right.  No evidence of drainable abscess at that site.  Her PCP recommended presentation to the ED for IV antibiotics and possible admission.  Patient states she first noticed a pimple on the right side of her forehead about 2 weeks ago and since then it has been getting bigger in size.  2 days ago she noticed that there was swelling around her right eye.  States she went to her doctor who gave her antibiotics but the swelling continued to get worse and started moving to the left side of her face.  Denies having any pain.  Denies having any fevers, chills, nausea, or vomiting.  Denies any change in her vision.  ED Course: Afebrile and hemodynamically stable.  No leukocytosis and lactic acid normal.  CT face/sinuses with contrast done at [facility] on November 13 showing right forehead abscess measuring approximately 12 mm.  Forehead and facial cellulitis, most pronounced in the periorbital region on the right.  No evidence of drainable abscess at the site other than the right forehead.  No post septal orbital pathology.  Also showing subtotal  opacification of the right maxillary sinus either due to acute sinusitis or large retention cyst.  Patient underwent a second I&D in the ED with significant purulent discharge and removal of a cyst like structure.  Wound left open.  Patient received vancomycin in the ED.  Review of Systems: As per HPI otherwise 10 point review of systems negative.  Past Medical History:  Diagnosis Date  . Allergy   . Anemia   . Blood transfusion without reported diagnosis   . Fibroid, uterine   . Pneumonia 2017  . PONV (postoperative nausea and vomiting)     Past Surgical History:  Procedure Laterality Date  . MYOMECTOMY    . polyp removed from ovary        reports that she has never smoked. She has never used smokeless tobacco. She reports that she drinks alcohol. She reports that she does not use drugs.  Allergies  Allergen Reactions  . Latex Hives  . Adhesive [Tape] Rash    Family History  Problem Relation Age of Onset  . Breast cancer Mother   . Lung cancer Father   . Colon cancer Maternal Aunt        80's  . Esophageal cancer Neg Hx   . Rectal cancer Neg Hx   . Stomach cancer Neg Hx     Prior to Admission medications   Medication Sig Start Date End Date Taking? Authorizing Provider  cholecalciferol (VITAMIN D3) 25 MCG (1000 UT) tablet Take 1,000 Units by mouth daily.   Yes [provider]  drospirenone-ethinyl estradiol (YAZ,GIANVI,LORYNA) 3-0.02 MG tablet Take 1 tablet by mouth daily. 07/08/17  Yes [provider]  Ferrous Sulfate (FE TABS PO) Take 1 tablet by mouth daily.    Yes [provider]  Multiple Vitamin (MULTIVITAMIN) capsule Take 1 capsule by mouth daily.   Yes [provider]  sulfamethoxazole-trimethoprim (BACTRIM DS,SEPTRA DS) 800-160 MG tablet Take 1 tablet by mouth 2 (two) times daily. for 10 days 01/21/18  Yes [provider]  norethindrone-ethinyl estradiol (LOESTRIN 1/20, 21,) 1-20 MG-MCG tablet Take 1 tablet by mouth  daily. Patient not taking: Reported on 01/23/2018 02/26/12   Ena Dawley, MD    Physical Exam: Vitals:   01/23/18 0001 01/23/18 0052 01/23/18 0214 01/23/18 0541  BP: 127/68 128/67 (!) 141/65 126/63  Pulse: 88 90 85 82  Resp: 16 16 18 15   Temp:  98.6 F (37 C) 98.5 F (36.9 C) 98.3 F (36.8 C)  TempSrc:  Oral Oral Oral  SpO2: 99% 100% 99% 100%  Weight:      Height:        Physical Exam  Constitutional: She is oriented to person, place, and time. She appears well-developed and well-nourished. No distress.  HENT:  Mouth/Throat: Oropharynx is clear and moist.  Eyes: Pupils are equal, round, and reactive to light. EOM are normal.  Neck: Neck supple. No tracheal deviation present.  Cardiovascular: Normal rate, regular rhythm and intact distal pulses.  Pulmonary/Chest: Effort normal and breath sounds normal. No respiratory distress. She has no wheezes. She has no rales.  Abdominal: Soft. Bowel sounds are normal. She exhibits no distension. There is no tenderness.  Musculoskeletal: She exhibits no edema.  Neurological: She is alert and oriented to person, place, and time.  Right side forehead abscess covered with Band-Aid Right periorbital region appears edematous Please see images below  Skin: Skin is warm and dry. She is not diaphoretic.  Psychiatric: Her behavior is normal.    Images taken in the ED:        Labs on Admission: I have personally reviewed following labs and imaging studies  CBC: Recent Labs  Lab 01/22/18 2246  WBC 9.4  NEUTROABS 6.3  HGB 11.0*  HCT 36.1  MCV 81.3  PLT 240*   Basic Metabolic Panel: Recent Labs  Lab 01/22/18 2246  NA 135  K 3.5  CL 103  CO2 22  GLUCOSE 130*  BUN 13  CREATININE 0.96  CALCIUM 8.7*   GFR: Estimated Creatinine Clearance: 82.2 mL/min (by C-G formula based on SCr of 0.96 mg/dL). Liver Function Tests: No results for input(s): AST, ALT, ALKPHOS, BILITOT, PROT, ALBUMIN in the last 168 hours. No results  for input(s): LIPASE, AMYLASE in the last 168 hours. No results for input(s): AMMONIA in the last 168 hours. Coagulation Profile: No results for input(s): INR, PROTIME in the last 168 hours. Cardiac Enzymes: No results for input(s): CKTOTAL, CKMB, CKMBINDEX, TROPONINI in the last 168 hours. BNP (last 3 results) No results for input(s): PROBNP in the last 8760 hours. HbA1C: No results for input(s): HGBA1C in the last 72 hours. CBG: No results for input(s): GLUCAP in the last 168 hours. Lipid Profile: No results for input(s): CHOL, HDL, LDLCALC, TRIG, CHOLHDL, LDLDIRECT in the last 72 hours. Thyroid Function Tests: No results for input(s): TSH, T4TOTAL, FREET4, T3FREE, THYROIDAB in the last 72 hours. Anemia Panel: No results for input(s): VITAMINB12, FOLATE, FERRITIN, TIBC, IRON, RETICCTPCT in the last 72 hours. Urine analysis: No results found for: COLORURINE,  APPEARANCEUR, LABSPEC, PHURINE, GLUCOSEU, HGBUR, BILIRUBINUR, KETONESUR, PROTEINUR, UROBILINOGEN, NITRITE, LEUKOCYTESUR  Radiological Exams on Admission: No results found.  Assessment/Plan Principal Problem:   Facial cellulitis Active Problems:   Chronic anemia   Abscess of forehead   Thrombocytosis (HCC)   Right forehead abscess/facial cellulitis Afebrile and hemodynamically stable.  No leukocytosis and lactic acid normal.  CT face/sinuses with contrast done at [facility] on November 13 showing right forehead abscess measuring approximately 12 mm.  Forehead and facial cellulitis, most pronounced in the periorbital region on the right.  No evidence of drainable abscess at the site other than the right forehead.  No post septal orbital pathology.  Patient underwent a second I&D in the ED with significant purulent discharge and removal of a cyst like structure.  Wound left open. -Continue vancomycin -IV fluid hydration -Tylenol PRN -Wound care consult  Chronic anemia -In the setting of menorrhagia due to uterine fibroids.   Hemoglobin 11.0, stable.  -Continue home iron supplement  Chronic thrombocytosis -Platelet count 548,000, was 524,000 a year ago. -Patient will need continued outpatient monitoring by PCP  DVT prophylaxis: Lovenox Code Status: Patient wishes to be full code. Family Communication: No family available. Disposition Plan: Anticipate discharge to home in 1 to 2 days Consults called: None Admission status: Observation  Shela Leff MD [facility] Pager [phone]  If 7PM-7AM, please contact night-coverage [email or web address] Password [email or web address]  01/23/2018, 6:20 AM

## 2018-01-24 DIAGNOSIS — L03211 Cellulitis of face: Secondary | ICD-10-CM

## 2018-01-24 DIAGNOSIS — Z91040 Latex allergy status: Secondary | ICD-10-CM

## 2018-01-24 DIAGNOSIS — L03213 Periorbital cellulitis: Principal | ICD-10-CM

## 2018-01-24 DIAGNOSIS — Z91048 Other nonmedicinal substance allergy status: Secondary | ICD-10-CM

## 2018-01-24 LAB — CREATININE, SERUM
CREATININE: 0.78 mg/dL (ref 0.44–1.00)
GFR calc Af Amer: >60 mL/min (ref >60)
GFR calc non Af Amer: >60 mL/min (ref >60)

## 2018-01-24 MED ORDER — CEPHALEXIN 500 MG PO CAPS
500.0000 mg | ORAL_CAPSULE | Freq: Four times a day (QID) | ORAL | Status: DC
  Administered 2018-01-24 – 2018-01-25 (×3): 500 mg via ORAL
  Filled 2018-01-24 (×3): qty 1

## 2018-01-24 MED ORDER — CEPHALEXIN 500 MG PO CAPS
500.0000 mg | ORAL_CAPSULE | Freq: Two times a day (BID) | ORAL | Status: DC
  Administered 2018-01-24: 500 mg via ORAL
  Filled 2018-01-24: qty 1

## 2018-01-24 NOTE — Progress Notes (Signed)
PROGRESS NOTE  Stephanie Hunter YKD:[id number] DOB: 1965/09/15 DOA: 01/22/2018 PCP: Bartholome Bill, MD  HPI/Brief Narrative Stephanie Hunter is a 51 y.o. year old female with medical history significant for  Uterine fibroids and anemia who presented on 01/22/2018 with worsening swelling over right forehead and was found to have forehead abscess and right periorbital cellulitis after failed outpatient treatment with Bactrim.   Empirically treated with vancomycin on admission however has remained hemodynamically stable and no signs of sepsis   ID curbsided on 11/4 and recommended d/cing Vancomycin in favor of non-MRSA coverage with  transition to Keflex if continues to remain stable in next 24 hours.  CT scan negative for any septal cellulitis   Subjective Feels swelling in both eyes is improving. No fevers or changes in vision  Assessment/Plan:  1. Nonpurulent periorbital cellulitis of right eye, improving.  Discontinued vancomycin on 11/14 given no purulence of systemic signs to suggest MRSA. More likely strep will monitor over 24 hours on keflex. If stable plan for d/c on 11/16. Remains afebrile and hemodynamically stable. 2. Right forehead abscess, status post I&D in ED.  Following wound care recommendations. 3. Thrombocytosis, chronic. Stable at baseline  Code Status: full code   Family Communication: no family at bedside   Disposition Plan: monitor over 24 hours, d/c on 11/16 if clinically stable on keflex    Consultants:  ID (curbside, phone     Procedures:  I&D on admission by EDP   Antimicrobials: Anti-infectives (From admission, onward)   Start     Dose/Rate Route Frequency Ordered Stop   01/24/18 1000  vancomycin (VANCOCIN) 1,250 mg in sodium chloride 0.9 % 250 mL IVPB  Status:  Discontinued     1,250 mg 166.7 mL/hr over 90 Minutes Intravenous Every 24 hours 01/23/18 0949 01/23/18 1013   01/24/18 1000  vancomycin (VANCOCIN) 1,500 mg in sodium chloride 0.9 %  500 mL IVPB  Status:  Discontinued     1,500 mg 250 mL/hr over 120 Minutes Intravenous Every 24 hours 01/23/18 1013 01/23/18 1324   01/24/18 1000  cephALEXin (KEFLEX) capsule 500 mg     500 mg Oral Every 12 hours 01/24/18 0835 01/29/18 0959   01/23/18 1000  vancomycin (VANCOCIN) 1,250 mg in sodium chloride 0.9 % 250 mL IVPB  Status:  Discontinued     1,250 mg 166.7 mL/hr over 90 Minutes Intravenous Every 12 hours 01/23/18 0649 01/23/18 0949   01/22/18 2250  vancomycin (VANCOCIN) 1000 MG powder    Note to Pharmacy:  Amedeo Gory   : cabinet override      01/22/18 2250 01/23/18 1059   01/22/18 2245  vancomycin (VANCOCIN) IVPB 1000 mg/200 mL premix  Status:  Discontinued     1,000 mg 200 mL/hr over 60 Minutes Intravenous  Once 01/22/18 2238 01/22/18 2240   01/22/18 2245  vancomycin (VANCOCIN) 2,000 mg in sodium chloride 0.9 % 500 mL IVPB     2,000 mg 250 mL/hr over 120 Minutes Intravenous  Once 01/22/18 2240 01/23/18 0109         Cultures: none Telemetry:no  DVT prophylaxis: lovenox   Objective: Vitals:   01/23/18 1436 01/23/18 2056 01/24/18 0529 01/24/18 1322  BP: 130/83 136/78 138/74 (!) 148/86  Pulse: 91 82 82 88  Resp: 16 20 20 14   Temp: 99.5 F (37.5 C) 98.5 F (36.9 C) 99 F (37.2 C) 99 F (37.2 C)  TempSrc: Oral Oral Oral Oral  SpO2: 99% 100% 99% 100%  Weight:  Height:        Intake/Output Summary (Last 24 hours) at 01/24/2018 1448 Last data filed at 01/24/2018 9528 Gross per 24 hour  Intake 858.75 ml  Output -  Net 858.75 ml   Filed Weights   01/22/18 2217  Weight: 105.6 kg    Exam:  Constitutional:normal appearing female Eyes: EOMI, anicteric, normal conjunctivae, slight swelling of both eyelids, much improved from prior exams ENMT: Oropharynx with moist mucous membranes, normal dentition Cardiovascular: RRR no MRGs, with no peripheral edema Respiratory: Normal respiratory effort, clear breath sounds  Abdomen: Soft,non-tender, with no  HSM Skin: bandage on right forehead with slight swelling but no erythema or drainage Neurologic: Grossly no focal neuro deficit. Psychiatric:Appropriate affect, and mood. Mental status AAOx3  Data Reviewed: CBC: Recent Labs  Lab 01/22/18 2246  WBC 9.4  NEUTROABS 6.3  HGB 11.0*  HCT 36.1  MCV 81.3  PLT 413*   Basic Metabolic Panel: Recent Labs  Lab 01/22/18 2246 01/24/18 0533  NA 135  --   K 3.5  --   CL 103  --   CO2 22  --   GLUCOSE 130*  --   BUN 13  --   CREATININE 0.96 0.78  CALCIUM 8.7*  --    GFR: Estimated Creatinine Clearance: 98.6 mL/min (by C-G formula based on SCr of 0.78 mg/dL). Liver Function Tests: No results for input(s): AST, ALT, ALKPHOS, BILITOT, PROT, ALBUMIN in the last 168 hours. No results for input(s): LIPASE, AMYLASE in the last 168 hours. No results for input(s): AMMONIA in the last 168 hours. Coagulation Profile: No results for input(s): INR, PROTIME in the last 168 hours. Cardiac Enzymes: No results for input(s): CKTOTAL, CKMB, CKMBINDEX, TROPONINI in the last 168 hours. BNP (last 3 results) No results for input(s): PROBNP in the last 8760 hours. HbA1C: No results for input(s): HGBA1C in the last 72 hours. CBG: No results for input(s): GLUCAP in the last 168 hours. Lipid Profile: No results for input(s): CHOL, HDL, LDLCALC, TRIG, CHOLHDL, LDLDIRECT in the last 72 hours. Thyroid Function Tests: No results for input(s): TSH, T4TOTAL, FREET4, T3FREE, THYROIDAB in the last 72 hours. Anemia Panel: No results for input(s): VITAMINB12, FOLATE, FERRITIN, TIBC, IRON, RETICCTPCT in the last 72 hours. Urine analysis: No results found for: COLORURINE, APPEARANCEUR, LABSPEC, PHURINE, GLUCOSEU, HGBUR, BILIRUBINUR, KETONESUR, PROTEINUR, UROBILINOGEN, NITRITE, LEUKOCYTESUR Sepsis Labs: @LABRCNTIP (procalcitonin:4,lacticidven:4)  )No results found for this or any previous visit (from the past 240 hour(s)).    Studies: No results  found.  Scheduled Meds: . cephALEXin  500 mg Oral Q12H  . cholecalciferol  1,000 Units Oral Daily  . drospirenone-ethinyl estradiol  1 tablet Oral Daily  . enoxaparin (LOVENOX) injection  0.5 mg/kg Subcutaneous Q24H  . ferrous sulfate  325 mg Oral Q breakfast  . multivitamin with minerals  1 tablet Oral Daily  . mupirocin cream   Topical Daily    Continuous Infusions: . sodium chloride Stopped (01/23/18 1956)     LOS: 1 day     Desiree Hane, MD [facility] Pager [phone]  If 7PM-7AM, please contact night-coverage [email or web address] Password [email or web address] 01/24/2018, 2:48 PM

## 2018-01-24 NOTE — Discharge Summary (Signed)
Discharge Summary  Stephanie Hunter OZD:[id number] DOB: Aug 06, 1965  PCP: Bartholome Bill, MD  Admit date: 01/22/2018 Discharge date: 01/25/2018   Time spent: < 25 minutes  Admitted From: Home Disposition: Home  Recommendations for Outpatient Follow-up:  1. Follow up with PCP in 1 week 2. Continue Augmentin for additional 9 days 3.     Discharge Diagnoses:  Active Hospital Problems   Diagnosis Date Noted  . Facial cellulitis 01/22/2018  . Abscess of forehead 01/23/2018  . Thrombocytosis ([place]) 01/23/2018  . Cellulitis 01/23/2018  . Chronic anemia 10/03/2017    Resolved Hospital Problems  No resolved problems to display.    Discharge Condition: Stable  CODE STATUS: Full code Diet recommendation:     History of present illness:  Stephanie Hunter a 52 y.o.year old femalewith medical history significant for Uterine fibroids and anemiawho presented on 11/13/2019with worsening swelling over right forehead and was found to have forehead abscess and right periorbital cellulitis after failed outpatient treatment with Bactrim.  Remaining hospital course addressed in problem based format below:   Hospital Course:   Nonpurulent periorbital cellulitis.  Patient was admitted because she failed outpatient treatment with Bactrim.  Initially upon admission was started on empiric vancomycin due to concern concern for MRSA.  However patient  remained hemodynamically stable with no signs of sepsis.  Upon discussion with ID consultants doubt any true MRSA.  Patient was transitioned to Keflex instead for strep coverage and monitored over 24 hours where she remained afebrile, hemodynamically stable, with significant improvement in periorbital swelling.  Of note CT scan prior to admission was negative for any septal cellulitis and patient's vision remained intact.  She will continue Augmentin 500 mg 4 times daily for additional 9 days to complete 10-day course of beta-lactam  therapy.  Right forehead abscess.  Received I&D in ED.  Wound care saw patient hospital recommended Bactroban for superficial care, no packing was needed.   Consultations:  Infectious disease  Procedures/Studies: None  Discharge Exam: BP 140/77 (BP Location: Left Arm)   Pulse 93   Temp 98.7 F (37.1 C) (Oral)   Resp 14   Ht 5\' 4"  (1.626 m)   Wt 105.6 kg   LMP 01/01/2018 (Approximate)   SpO2 97%   BMI 39.96 kg/m   General: Lying in bed, no apparent distress Eyes: EOMI, anicteric ENT: Oral Mucosa clear and moist Cardiovascular: regular rate and rhythm, no murmurs, rubs or gallops, no edema, Respiratory: Normal respiratory effort, lungs clear to auscultation bilaterally Abdomen: soft, non-distended, non-tender, normal bowel sounds Skin: Small lesion on right forehead healing well with no drainage or swelling.  Minimal swelling of right eyelid with no erythema and no drainage Neurologic: Grossly no focal neuro deficit.Mental status AAOx3, speech normal, Psychiatric:Appropriate affect, and mood   Discharge Instructions You were cared for by a hospitalist during your hospital stay. If you have any questions about your discharge medications or the care you received while you were in the hospital after you are discharged, you can call the unit and asked to speak with the hospitalist on call if the hospitalist that took care of you is not available. Once you are discharged, your primary care physician will handle any further medical issues. Please note that NO REFILLS for any discharge medications will be authorized once you are discharged, as it is imperative that you return to your primary care physician (or establish a relationship with a primary care physician if you do not have one) for your aftercare needs so  that they can reassess your need for medications and monitor your lab values.  Discharge Instructions    Diet - low sodium heart healthy   Complete by:  As directed     Diet - low sodium heart healthy   Complete by:  As directed    Increase activity slowly   Complete by:  As directed    Increase activity slowly   Complete by:  As directed      Allergies as of 01/25/2018      Reactions   Latex Hives   Adhesive [tape] Rash      Medication List    STOP taking these medications   sulfamethoxazole-trimethoprim 800-160 MG tablet Commonly known as:  BACTRIM DS,SEPTRA DS     TAKE these medications   cephALEXin 500 MG capsule Commonly known as:  KEFLEX Take 1 capsule (500 mg total) by mouth every 6 (six) hours for 9 days.   cholecalciferol 25 MCG (1000 UT) tablet Commonly known as:  VITAMIN D3 Take 1,000 Units by mouth daily.   drospirenone-ethinyl estradiol 3-0.02 MG tablet Commonly known as:  YAZ,GIANVI,LORYNA Take 1 tablet by mouth daily.   FE TABS PO Take 1 tablet by mouth daily.   multivitamin capsule Take 1 capsule by mouth daily.   mupirocin cream 2 % Commonly known as:  BACTROBAN Apply topically daily.   norethindrone-ethinyl estradiol 1-20 MG-MCG tablet Commonly known as:  MICROGESTIN,JUNEL,LOESTRIN Take 1 tablet by mouth daily.      Allergies  Allergen Reactions  . Latex Hives  . Adhesive [Tape] Rash      The results of significant diagnostics from this hospitalization (including imaging, microbiology, ancillary and laboratory) are listed below for reference.    Significant Diagnostic Studies: No results found.  Microbiology: No results found for this or any previous visit (from the past 240 hour(s)).   Labs: Basic Metabolic Panel: Recent Labs  Lab 01/22/18 2246 01/24/18 0533  NA 135  --   K 3.5  --   CL 103  --   CO2 22  --   GLUCOSE 130*  --   BUN 13  --   CREATININE 0.96 0.78  CALCIUM 8.7*  --    Liver Function Tests: No results for input(s): AST, ALT, ALKPHOS, BILITOT, PROT, ALBUMIN in the last 168 hours. No results for input(s): LIPASE, AMYLASE in the last 168 hours. No results for input(s):  AMMONIA in the last 168 hours. CBC: Recent Labs  Lab 01/22/18 2246  WBC 9.4  NEUTROABS 6.3  HGB 11.0*  HCT 36.1  MCV 81.3  PLT 548*   Cardiac Enzymes: No results for input(s): CKTOTAL, CKMB, CKMBINDEX, TROPONINI in the last 168 hours. BNP: BNP (last 3 results) No results for input(s): BNP in the last 8760 hours.  ProBNP (last 3 results) No results for input(s): PROBNP in the last 8760 hours.  CBG: No results for input(s): GLUCAP in the last 168 hours.     Signed:  Desiree Hane, MD [facility] 01/25/2018, 10:19 AM

## 2018-01-24 NOTE — Consult Note (Signed)
Date of Admission:  01/22/2018          Reason for Consult: Facial cellultiis and orbital cellulitis   Referring Provider: Dr. Lonny Prude   Assessment:  1. Forehead abscess 2. Periorbital cellulitis  Plan:  1. I think treating with a beta-lactam which would target streptococcal infection is rational though she is being underdosed with her cephalexin so therefore increased to 500 mg 4 times daily 2. Continue to monitor closely 3. I  Would give 10 day course of beta lactam therapy if she continues to improve 4. Dr. Baxter Flattery is available for questions over the weekend.   Principal Problem:   Facial cellulitis Active Problems:   Chronic anemia   Abscess of forehead   Thrombocytosis (HCC)   Cellulitis   Scheduled Meds: . [START ON 01/25/2018] cephALEXin  500 mg Oral Q6H  . cholecalciferol  1,000 Units Oral Daily  . drospirenone-ethinyl estradiol  1 tablet Oral Daily  . enoxaparin (LOVENOX) injection  0.5 mg/kg Subcutaneous Q24H  . ferrous sulfate  325 mg Oral Q breakfast  . multivitamin with minerals  1 tablet Oral Daily  . mupirocin cream   Topical Daily   Continuous Infusions: . sodium chloride Stopped (01/23/18 1956)   PRN Meds:.sodium chloride, acetaminophen **OR** acetaminophen  HPI: Stephanie Hunter is a 52 y.o. female who developed a forehead abscess and was seen at an outpatient clinic by [facility].  She underwent incision and drainage of this area and the culture was was sent from the wound although I cannot find the results of this.  She was seen on November 13th when she had I and D and then November 15th when she had worsening of periorbital edema and CT of the sinuses was ordered which showed diminishment in size of her facial abscess but increase in periorbital edema more pronounced on the right.  When periorbital edema involves her left eyelid as well she was referred to the emergency room at [facility] and admitted to the hospitalist  service.  She was treated with IV vancomycin and has had improvement in her periorbital cellulitis while on IV vancomycin.  I was called by the primary team yesterday with they are concerned that this more likely might be a streptococcal infection due to that she failed high-dose oral Bactrim therapy.  I think this is rational and I did agree with the idea of treating her with cefazolin followed by possibly cephalexin.  She has been present transition to cephalexin already though the dose is only 500 mg twice daily when it should be at least 4 times daily in a patient with normal renal function and who is also with increased body habitus.   Review of Systems: Review of Systems  Constitutional: Negative for chills, diaphoresis, fever, malaise/fatigue and weight loss.  HENT: Negative for congestion, hearing loss, sore throat and tinnitus.   Eyes: Negative for blurred vision and double vision.  Respiratory: Negative for cough, sputum production, shortness of breath and wheezing.   Cardiovascular: Negative for chest pain, palpitations and leg swelling.  Gastrointestinal: Negative for abdominal pain, blood in stool, constipation, diarrhea, heartburn, melena, nausea and vomiting.  Genitourinary: Negative for dysuria, flank pain and hematuria.  Musculoskeletal: Negative for back pain, falls, joint pain and myalgias.  Skin: Negative for itching and rash.  Neurological: Negative for dizziness, sensory change, focal weakness, loss of consciousness, weakness and headaches.  Endo/Heme/Allergies: Does not bruise/bleed easily.  Psychiatric/Behavioral: Negative for depression, memory loss and suicidal  ideas. The patient is not nervous/anxious.     Past Medical History:  Diagnosis Date  . Allergy   . Anemia   . Blood transfusion without reported diagnosis   . Fibroid, uterine   . Pneumonia 2017  . PONV (postoperative nausea and vomiting)     Social History   Tobacco Use  . Smoking status: Never  Smoker  . Smokeless tobacco: Never Used  Substance Use Topics  . Alcohol use: Yes    Comment: occ.   . Drug use: No    Family History  Problem Relation Age of Onset  . Breast cancer Mother   . Lung cancer Father   . Colon cancer Maternal Aunt        80's  . Esophageal cancer Neg Hx   . Rectal cancer Neg Hx   . Stomach cancer Neg Hx    Allergies  Allergen Reactions  . Latex Hives  . Adhesive [Tape] Rash    OBJECTIVE: Blood pressure (!) 148/86, pulse 88, temperature 99 F (37.2 C), temperature source Oral, resp. rate 14, height 5\' 4"  (1.626 m), weight 105.6 kg, last menstrual period 01/01/2018, SpO2 100 %.  Physical Exam  Constitutional: She is oriented to person, place, and time. She appears well-developed and well-nourished. She is cooperative. She does not appear ill. No distress.  HENT:  Head: Atraumatic.  Right Ear: Hearing and external ear normal.  Left Ear: Hearing and external ear normal.  Nose: No rhinorrhea or nasal deformity. No epistaxis.  Eyes: EOM are normal. Right conjunctiva is not injected. Left conjunctiva is not injected. No scleral icterus.  Neck: Normal range of motion. Neck supple. No JVD present.  Cardiovascular: Normal rate, regular rhythm, S1 normal, S2 normal and normal heart sounds. Exam reveals no friction rub.  No murmur heard. Abdominal: Soft. Normal appearance and bowel sounds are normal. She exhibits no distension and no ascites. There is no hepatosplenomegaly. There is no tenderness.  Musculoskeletal: Normal range of motion.       Right shoulder: Normal.       Left shoulder: Normal.       Right hip: Normal.       Left hip: Normal.       Right knee: Normal.       Left knee: Normal.  Lymphadenopathy:       Head (right side): No submandibular, no preauricular and no posterior auricular adenopathy present.       Head (left side): No submandibular, no preauricular and no posterior auricular adenopathy present.    She has no cervical  adenopathy.       Right cervical: No superficial cervical and no deep cervical adenopathy present.      Left cervical: No superficial cervical and no deep cervical adenopathy present.  Neurological: She is alert and oriented to person, place, and time. She has normal strength. No sensory deficit. Coordination and gait normal.  Skin: Skin is warm, dry and intact. No abrasion, no bruising, no ecchymosis, no lesion and no rash noted. She is not diaphoretic. No cyanosis or erythema. No pallor. Nails show no clubbing.  Psychiatric: She has a normal mood and affect. Her speech is normal and behavior is normal. Judgment and thought content normal. Cognition and memory are normal. She is attentive.   She does have periorbital edema worse on the right than the left but this is greatly improved compared to images when she was admitted    Lab Results Lab Results  Component Value Date  WBC 9.4 01/22/2018   HGB 11.0 (L) 01/22/2018   HCT 36.1 01/22/2018   MCV 81.3 01/22/2018   PLT 548 (H) 01/22/2018    Lab Results  Component Value Date   CREATININE 0.78 01/24/2018   BUN 13 01/22/2018   NA 135 01/22/2018   K 3.5 01/22/2018   CL 103 01/22/2018   CO2 22 01/22/2018    Lab Results  Component Value Date   ALT 12 (L) 09/02/2016   AST 22 09/02/2016   ALKPHOS 54 09/02/2016   BILITOT 0.8 09/02/2016     Microbiology: No results found for this or any previous visit (from the past 240 hour(s)).  Alcide Evener, [place] [facility] [phone] pager  01/24/2018, 6:35 PM

## 2018-01-25 MED ORDER — CEPHALEXIN 500 MG PO CAPS: 500.0000 mg | ORAL_CAPSULE | Freq: Four times a day (QID) | ORAL | 0 refills | Status: AC

## 2018-01-25 MED ORDER — MUPIROCIN CALCIUM 2 % EX CREA: TOPICAL_CREAM | Freq: Every day | CUTANEOUS | 0 refills | Status: AC

## 2018-01-25 NOTE — Progress Notes (Signed)
Patient discharged to home with family, discharge instructions reviewed with patient who verbalized understanding. New RX sent to pharmacy electronically.  

## 2018-10-06 ENCOUNTER — Other Ambulatory Visit: Payer: Self-pay

## 2018-10-06 DIAGNOSIS — Z20822 Contact with and (suspected) exposure to covid-19: Secondary | ICD-10-CM

## 2018-10-07 LAB — NOVEL CORONAVIRUS, NAA: SARS-CoV-2, NAA: NOT DETECTED

## 2018-12-11 ENCOUNTER — Other Ambulatory Visit: Payer: Self-pay | Admitting: Obstetrics & Gynecology

## 2018-12-11 DIAGNOSIS — R921 Mammographic calcification found on diagnostic imaging of breast: Secondary | ICD-10-CM

## 2018-12-19 ENCOUNTER — Ambulatory Visit
Admission: RE | Admit: 2018-12-19 | Discharge: 2018-12-19 | Disposition: A | Discharge status: Home / Self Care | Payer: 59 | Source: Ambulatory Visit | Attending: Obstetrics & Gynecology | Admitting: Obstetrics & Gynecology

## 2018-12-19 ENCOUNTER — Other Ambulatory Visit: Payer: Self-pay

## 2018-12-19 ENCOUNTER — Other Ambulatory Visit: Payer: Self-pay | Admitting: Obstetrics & Gynecology

## 2018-12-19 DIAGNOSIS — R921 Mammographic calcification found on diagnostic imaging of breast: Secondary | ICD-10-CM
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
